# Patient Record
Sex: Male | Born: 1935 | Race: White | Hispanic: No | Marital: Married | Smoking: Former smoker
Health system: Southern US, Community
[De-identification: ages and names within clinical notes are randomized; demographics above are authoritative.]

## PROBLEM LIST (undated history)

## (undated) DIAGNOSIS — J449 Chronic obstructive pulmonary disease, unspecified: Secondary | ICD-10-CM

## (undated) DIAGNOSIS — J329 Chronic sinusitis, unspecified: Secondary | ICD-10-CM

## (undated) DIAGNOSIS — F1010 Alcohol abuse, uncomplicated: Secondary | ICD-10-CM

## (undated) DIAGNOSIS — K449 Diaphragmatic hernia without obstruction or gangrene: Secondary | ICD-10-CM

## (undated) DIAGNOSIS — C3490 Malignant neoplasm of unspecified part of unspecified bronchus or lung: Secondary | ICD-10-CM

## (undated) DIAGNOSIS — K222 Esophageal obstruction: Secondary | ICD-10-CM

## (undated) DIAGNOSIS — J020 Streptococcal pharyngitis: Secondary | ICD-10-CM

## (undated) DIAGNOSIS — G459 Transient cerebral ischemic attack, unspecified: Secondary | ICD-10-CM

## (undated) DIAGNOSIS — N281 Cyst of kidney, acquired: Secondary | ICD-10-CM

## (undated) DIAGNOSIS — T7840XA Allergy, unspecified, initial encounter: Secondary | ICD-10-CM

## (undated) DIAGNOSIS — B0229 Other postherpetic nervous system involvement: Secondary | ICD-10-CM

## (undated) DIAGNOSIS — K219 Gastro-esophageal reflux disease without esophagitis: Secondary | ICD-10-CM

## (undated) DIAGNOSIS — K51411 Inflammatory polyps of colon with rectal bleeding: Secondary | ICD-10-CM

## (undated) DIAGNOSIS — I10 Essential (primary) hypertension: Secondary | ICD-10-CM

## (undated) DIAGNOSIS — F0390 Unspecified dementia without behavioral disturbance: Secondary | ICD-10-CM

## (undated) HISTORY — DX: Gastro-esophageal reflux disease without esophagitis: K21.9

## (undated) HISTORY — DX: Allergy, unspecified, initial encounter: T78.40XA

## (undated) HISTORY — DX: Chronic obstructive pulmonary disease, unspecified: J44.9

## (undated) HISTORY — DX: Inflammatory polyps of colon with rectal bleeding: K51.411

## (undated) HISTORY — DX: Chronic sinusitis, unspecified: J32.9

## (undated) HISTORY — DX: Unspecified dementia, unspecified severity, without behavioral disturbance, psychotic disturbance, mood disturbance, and anxiety: F03.90

## (undated) HISTORY — PX: LUNG REMOVAL, PARTIAL: SHX233

## (undated) HISTORY — DX: Other postherpetic nervous system involvement: B02.29

## (undated) HISTORY — DX: Esophageal obstruction: K22.2

## (undated) HISTORY — DX: Streptococcal pharyngitis: J02.0

## (undated) HISTORY — DX: Cyst of kidney, acquired: N28.1

## (undated) HISTORY — DX: Diaphragmatic hernia without obstruction or gangrene: K44.9

## (undated) HISTORY — DX: Alcohol abuse, uncomplicated: F10.10

---

## 2001-06-15 HISTORY — PX: LAPAROSCOPIC CHOLECYSTECTOMY: SUR755

## 2001-11-07 ENCOUNTER — Inpatient Hospital Stay (HOSPITAL_COMMUNITY): Admission: EM | Admit: 2001-11-07 | Discharge: 2001-11-16 | Payer: Self-pay | Admitting: Emergency Medicine

## 2001-11-07 ENCOUNTER — Encounter (INDEPENDENT_AMBULATORY_CARE_PROVIDER_SITE_OTHER): Payer: Self-pay | Admitting: Specialist

## 2001-11-07 ENCOUNTER — Encounter: Payer: Self-pay | Admitting: Family Medicine

## 2001-11-07 ENCOUNTER — Encounter: Payer: Self-pay | Admitting: Emergency Medicine

## 2001-11-10 ENCOUNTER — Encounter: Payer: Self-pay | Admitting: Family Medicine

## 2001-11-11 ENCOUNTER — Encounter: Payer: Self-pay | Admitting: Family Medicine

## 2001-11-15 ENCOUNTER — Encounter: Payer: Self-pay | Admitting: General Surgery

## 2001-11-16 ENCOUNTER — Encounter: Payer: Self-pay | Admitting: General Surgery

## 2001-11-16 ENCOUNTER — Encounter: Payer: Self-pay | Admitting: Surgery

## 2001-11-22 ENCOUNTER — Encounter: Admission: RE | Admit: 2001-11-22 | Discharge: 2001-11-22 | Payer: Self-pay | Admitting: Family Medicine

## 2005-12-31 ENCOUNTER — Inpatient Hospital Stay (HOSPITAL_COMMUNITY): Admission: EM | Admit: 2005-12-31 | Discharge: 2006-01-04 | Payer: Self-pay | Admitting: Emergency Medicine

## 2005-12-31 ENCOUNTER — Ambulatory Visit: Payer: Self-pay | Admitting: Internal Medicine

## 2006-01-01 ENCOUNTER — Encounter: Payer: Self-pay | Admitting: Internal Medicine

## 2006-01-01 ENCOUNTER — Ambulatory Visit: Payer: Self-pay | Admitting: Internal Medicine

## 2006-01-11 ENCOUNTER — Ambulatory Visit (HOSPITAL_COMMUNITY): Admission: RE | Admit: 2006-01-11 | Discharge: 2006-01-11 | Payer: Self-pay | Admitting: Internal Medicine

## 2006-01-22 ENCOUNTER — Encounter: Admission: RE | Admit: 2006-01-22 | Discharge: 2006-01-22 | Payer: Self-pay | Admitting: Surgery

## 2006-01-28 ENCOUNTER — Ambulatory Visit (HOSPITAL_COMMUNITY): Admission: RE | Admit: 2006-01-28 | Discharge: 2006-01-28 | Payer: Self-pay | Admitting: Surgery

## 2006-01-28 ENCOUNTER — Encounter (INDEPENDENT_AMBULATORY_CARE_PROVIDER_SITE_OTHER): Payer: Self-pay | Admitting: Specialist

## 2006-01-29 ENCOUNTER — Ambulatory Visit (HOSPITAL_COMMUNITY): Admission: RE | Admit: 2006-01-29 | Discharge: 2006-01-29 | Payer: Self-pay | Admitting: Interventional Radiology

## 2006-02-19 ENCOUNTER — Ambulatory Visit: Admission: RE | Admit: 2006-02-19 | Discharge: 2006-05-20 | Payer: Self-pay | Admitting: Radiation Oncology

## 2006-03-09 ENCOUNTER — Encounter (INDEPENDENT_AMBULATORY_CARE_PROVIDER_SITE_OTHER): Payer: Self-pay | Admitting: *Deleted

## 2006-03-09 ENCOUNTER — Inpatient Hospital Stay (HOSPITAL_COMMUNITY): Admission: RE | Admit: 2006-03-09 | Discharge: 2006-03-13 | Payer: Self-pay | Admitting: Surgery

## 2006-04-20 ENCOUNTER — Encounter: Admission: RE | Admit: 2006-04-20 | Discharge: 2006-04-20 | Payer: Self-pay | Admitting: Surgery

## 2007-05-09 ENCOUNTER — Ambulatory Visit: Admission: RE | Admit: 2007-05-09 | Discharge: 2007-06-14 | Payer: Self-pay | Admitting: Radiation Oncology

## 2010-07-05 ENCOUNTER — Encounter: Payer: Self-pay | Admitting: Surgery

## 2010-07-06 ENCOUNTER — Encounter: Payer: Self-pay | Admitting: Surgery

## 2010-07-18 DIAGNOSIS — G459 Transient cerebral ischemic attack, unspecified: Secondary | ICD-10-CM

## 2010-08-06 ENCOUNTER — Inpatient Hospital Stay (HOSPITAL_COMMUNITY)
Admission: EM | Admit: 2010-08-06 | Discharge: 2010-08-09 | DRG: 069 | Disposition: A | Discharge status: Home Health | Payer: Medicare Other | Attending: Internal Medicine | Admitting: Internal Medicine

## 2010-08-06 ENCOUNTER — Emergency Department (HOSPITAL_COMMUNITY): Payer: Medicare Other

## 2010-08-06 ENCOUNTER — Encounter (HOSPITAL_COMMUNITY): Payer: Self-pay | Admitting: Radiology

## 2010-08-06 DIAGNOSIS — R55 Syncope and collapse: Secondary | ICD-10-CM | POA: Diagnosis present

## 2010-08-06 DIAGNOSIS — R4701 Aphasia: Secondary | ICD-10-CM | POA: Diagnosis present

## 2010-08-06 DIAGNOSIS — G459 Transient cerebral ischemic attack, unspecified: Principal | ICD-10-CM | POA: Diagnosis present

## 2010-08-06 DIAGNOSIS — E785 Hyperlipidemia, unspecified: Secondary | ICD-10-CM | POA: Diagnosis present

## 2010-08-06 DIAGNOSIS — K219 Gastro-esophageal reflux disease without esophagitis: Secondary | ICD-10-CM | POA: Diagnosis present

## 2010-08-06 DIAGNOSIS — J449 Chronic obstructive pulmonary disease, unspecified: Secondary | ICD-10-CM | POA: Diagnosis present

## 2010-08-06 DIAGNOSIS — Z85118 Personal history of other malignant neoplasm of bronchus and lung: Secondary | ICD-10-CM

## 2010-08-06 DIAGNOSIS — J4489 Other specified chronic obstructive pulmonary disease: Secondary | ICD-10-CM | POA: Diagnosis present

## 2010-08-06 DIAGNOSIS — D72829 Elevated white blood cell count, unspecified: Secondary | ICD-10-CM | POA: Diagnosis present

## 2010-08-06 DIAGNOSIS — F0390 Unspecified dementia without behavioral disturbance: Secondary | ICD-10-CM | POA: Diagnosis present

## 2010-08-06 HISTORY — DX: Transient cerebral ischemic attack, unspecified: G45.9

## 2010-08-06 HISTORY — DX: Essential (primary) hypertension: I10

## 2010-08-06 HISTORY — DX: Malignant neoplasm of unspecified part of unspecified bronchus or lung: C34.90

## 2010-08-06 LAB — CBC
Hemoglobin: 13.4 g/dL (ref 13.0–17.0)
RBC: 4.32 MIL/uL (ref 4.22–5.81)
WBC: 13.2 10*3/uL — ABNORMAL HIGH (ref 4.0–10.5)

## 2010-08-06 LAB — POCT CARDIAC MARKERS
Myoglobin, poc: 82 ng/mL (ref 12–200)
Troponin i, poc: <0.05 ng/mL (ref 0.00–0.09)

## 2010-08-06 LAB — URINALYSIS, ROUTINE W REFLEX MICROSCOPIC
Hgb urine dipstick: NEGATIVE
Protein, ur: NEGATIVE mg/dL
Specific Gravity, Urine: 1.008 (ref 1.005–1.030)
Urine Glucose, Fasting: NEGATIVE mg/dL
Urobilinogen, UA: 0.2 mg/dL (ref 0.0–1.0)

## 2010-08-06 LAB — POCT I-STAT, CHEM 8
HCT: 42 % (ref 39.0–52.0)
Hemoglobin: 14.3 g/dL (ref 13.0–17.0)
Potassium: 4.6 mEq/L (ref 3.5–5.1)
Sodium: 134 mEq/L — ABNORMAL LOW (ref 135–145)
TCO2: 28 mmol/L (ref 0–100)

## 2010-08-06 LAB — DIFFERENTIAL
Basophils Absolute: 0.1 10*3/uL (ref 0.0–0.1)
Basophils Relative: 0 % (ref 0–1)
Neutro Abs: 10.5 10*3/uL — ABNORMAL HIGH (ref 1.7–7.7)
Neutrophils Relative %: 80 % — ABNORMAL HIGH (ref 43–77)

## 2010-08-06 LAB — CK TOTAL AND CKMB (NOT AT ARMC): Relative Index: INVALID (ref 0.0–2.5)

## 2010-08-06 LAB — TROPONIN I: Troponin I: <0.01 ng/mL (ref 0.00–0.06)

## 2010-08-07 ENCOUNTER — Inpatient Hospital Stay (HOSPITAL_COMMUNITY): Payer: Medicare Other

## 2010-08-07 ENCOUNTER — Encounter (HOSPITAL_COMMUNITY): Payer: Self-pay | Admitting: Radiology

## 2010-08-07 LAB — CARDIAC PANEL(CRET KIN+CKTOT+MB+TROPI)
CK, MB: 2.4 ng/mL (ref 0.3–4.0)
Total CK: 86 U/L (ref 7–232)
Troponin I: <0.01 ng/mL (ref 0.00–0.06)

## 2010-08-07 LAB — COMPREHENSIVE METABOLIC PANEL
AST: 22 U/L (ref 0–37)
Albumin: 3.7 g/dL (ref 3.5–5.2)
Chloride: 97 mEq/L (ref 96–112)
Creatinine, Ser: 0.97 mg/dL (ref 0.4–1.5)
GFR calc Af Amer: >60 mL/min (ref >60)
Total Bilirubin: 0.5 mg/dL (ref 0.3–1.2)

## 2010-08-07 LAB — BASIC METABOLIC PANEL
CO2: 30 mEq/L (ref 19–32)
Chloride: 94 mEq/L — ABNORMAL LOW (ref 96–112)
Glucose, Bld: 104 mg/dL — ABNORMAL HIGH (ref 70–99)
Potassium: 4.4 mEq/L (ref 3.5–5.1)
Sodium: 134 mEq/L — ABNORMAL LOW (ref 135–145)

## 2010-08-07 LAB — LIPID PANEL
HDL: 54 mg/dL (ref >39)
Total CHOL/HDL Ratio: 3.6 RATIO
Triglycerides: 82 mg/dL (ref <150)
VLDL: 16 mg/dL (ref 0–40)

## 2010-08-07 LAB — CBC
HCT: 37.9 % — ABNORMAL LOW (ref 39.0–52.0)
Hemoglobin: 12.8 g/dL — ABNORMAL LOW (ref 13.0–17.0)
MCHC: 33.8 g/dL (ref 30.0–36.0)
WBC: 9.8 10*3/uL (ref 4.0–10.5)

## 2010-08-07 LAB — URINE CULTURE: Culture  Setup Time: 201202221806

## 2010-08-07 LAB — BRAIN NATRIURETIC PEPTIDE: Pro B Natriuretic peptide (BNP): 121 pg/mL — ABNORMAL HIGH (ref 0.0–100.0)

## 2010-08-07 MED ORDER — GADOBENATE DIMEGLUMINE 529 MG/ML IV SOLN
20.0000 mL | Freq: Once | INTRAVENOUS | Status: AC
  Administered 2010-08-07: 20 mL via INTRAVENOUS

## 2010-08-08 DIAGNOSIS — I359 Nonrheumatic aortic valve disorder, unspecified: Secondary | ICD-10-CM

## 2010-08-08 LAB — DIFFERENTIAL
Basophils Absolute: 0 K/uL (ref 0.0–0.1)
Basophils Relative: 0 % (ref 0–1)
Eosinophils Absolute: 0.1 K/uL (ref 0.0–0.7)
Eosinophils Relative: 1 % (ref 0–5)
Lymphocytes Relative: 16 % (ref 12–46)
Lymphs Abs: 2 K/uL (ref 0.7–4.0)
Monocytes Absolute: 1.1 K/uL — ABNORMAL HIGH (ref 0.1–1.0)
Monocytes Relative: 9 % (ref 3–12)
Neutro Abs: 9.4 K/uL — ABNORMAL HIGH (ref 1.7–7.7)
Neutrophils Relative %: 75 % (ref 43–77)

## 2010-08-08 LAB — CBC
HCT: 36.8 % — ABNORMAL LOW (ref 39.0–52.0)
Hemoglobin: 12.3 g/dL — ABNORMAL LOW (ref 13.0–17.0)
MCH: 30.1 pg (ref 26.0–34.0)
MCHC: 33.4 g/dL (ref 30.0–36.0)
MCV: 90.2 fL (ref 78.0–100.0)
Platelets: 225 K/uL (ref 150–400)
RBC: 4.08 MIL/uL — ABNORMAL LOW (ref 4.22–5.81)
RDW: 13.4 % (ref 11.5–15.5)
WBC: 12.6 K/uL — ABNORMAL HIGH (ref 4.0–10.5)

## 2010-08-08 LAB — COMPREHENSIVE METABOLIC PANEL
ALT: 18 U/L (ref 0–53)
CO2: 27 mEq/L (ref 19–32)
Calcium: 8.9 mg/dL (ref 8.4–10.5)
Creatinine, Ser: 0.94 mg/dL (ref 0.4–1.5)
GFR calc non Af Amer: >60 mL/min (ref >60)
Glucose, Bld: 102 mg/dL — ABNORMAL HIGH (ref 70–99)
Sodium: 130 mEq/L — ABNORMAL LOW (ref 135–145)
Total Protein: 6.9 g/dL (ref 6.0–8.3)

## 2010-08-08 LAB — MAGNESIUM: Magnesium: 1.9 mg/dL (ref 1.5–2.5)

## 2010-08-08 LAB — PROTIME-INR
INR: 1.05 (ref 0.00–1.49)
Prothrombin Time: 13.9 seconds (ref 11.6–15.2)

## 2010-08-09 NOTE — Discharge Summary (Signed)
Alex [name], Alex [name] NO.:  [id number]  MEDICAL RECORD NO.:  [id number]           PATIENT TYPE:  I  LOCATION:  3742                         FACILITY:  [facility]  PHYSICIAN:  Talmage Nap, MD  DATE OF BIRTH:  1933/01/04  DATE OF ADMISSION:  08/06/2010 DATE OF DISCHARGE:  08/08/2010                        DISCHARGE SUMMARY - REFERRING   PRIMARY CARE PHYSICIAN:  Unassigned.  CONSULTANT:  Neurology, Dr. Joycelyn Schmid, MD  DISCHARGE DIAGNOSES: 1. Questionable transient ischemic attack. 2. Chronic obstructive pulmonary disease. 3. Questionable dementia. 4. Leukocytosis, source is unknown. 5. Gastroesophageal reflux disease. 6. Dyslipidemia. 7. History of lung cancer status post lobectomy.  HISTORY OF PRESENT ILLNESS:  The patient is a 77 year old Caucasian male with history of lung CA status post lobectomy, status post radiotherapy was admitted to the hospital on August 06, 2010, by Dr. Lonia Blood, with history of having passed out and was said to have had right-sided weakness.  He was apparently transferred from [facility] for syncopal workup.  The patient denied any premonitory symptoms prior to the onset of fall.  He denied any chest pain, denied any shortness of breath, and was subsequently admitted for further evaluation.  PREADMISSION MEDICATIONS: 1. Amlodipine 5 mg p.o. daily. 2. Clorazepate dipotassium 7.5 mg p.o. b.i.d. 3. Metoprolol 50 mg p.o. b.i.d.  ALLERGIES:  He has no known allergies.  PAST SURGICAL HISTORY: 1. Right lung CA status post lobectomy. 2. Esophageal stricture status post dilatation. 3. Cholecystectomy.  ALLERGIES:  No known allergies.  SOCIAL HISTORY:  Negative for alcohol and tobacco use.  The patient is retired.  FAMILY HISTORY:  Positive for breast CA.  REVIEW OF SYSTEMS:  Essentially as documented in the initial history and physical.  At that time, the patient was seen by the  admitting physician.  PHYSICAL EXAMINATION:  VITAL SIGNS:  Temperature 98.3, blood pressure is 173/82, subsequent repeat was 149/85, pulse 82, respiratory 78, and he was saturating 100% on room air. HEENT:  Pupils are reactive to light and extraocular muscles are intact. NECK:  He had no jugular venous distention.  No carotid bruit.  No lymphadenopathy. CHEST:  Was said to have been clear to auscultation.  No adventitious sounds. HEART:  Sounds are 1 and 2.  No murmur. ABDOMEN:  Soft, nontender.  Liver, spleen, and kidney not palpable. Bowel sounds are positive. EXTREMITIES:  No pedal edema. NEUROLOGIC:  Nonfocal. MUSCULOSKELETAL:  Unremarkable. SKIN:  Showed normal turgor.  LABORATORY DATA:  Initial urinalysis was negative.  Chem-8 stat showed sodium of 134, potassium of 4.6, chloride of 99, glucose 104, BUN is 10, and creatinine is 1.1.  Cardiac marker, troponin-I less than 0.05 and less than 0.01.  Comprehensive metabolic panel showed sodium of 136, potassium of 4.0, chloride of 97 with a bicarb of 28, glucose is 92, BUN is 8, and creatinine is 0.97.  Magnesium level is 2.0, phosphorus level is 3.5, and BNP 121.  Lipid panel showed total cholesterol 192, triglyceride 82, HDL cholesterol 54, LDL cholesterol 122, and TSH 2.476 normal.  Urine culture, no growth.  A repeat comprehensive metabolic panel done on August 08, 2010,  showed sodium of 130, potassium of 4.0, chloride of 94 with a bicarb of 27, glucose is 102, BUN is 8, creatinine is 0.94, magnesium level is 1.9.  Complete blood count with differential showed WBC of 12.6, hemoglobin of 12.3, hematocrit of 36.8, MCV of 90.2 with a platelet count of 2-5.  Coagulation profile showed PT 13.9, INR 1.05 and APTT of 33.  IMAGING STUDIES:  EKG as per the initial history and physical was said to have showed left bundle branch block with nonspecific ST-wave changes with LVH.  Imaging studies done include chest x-ray, it did not  show any segmental consolidation, pulmonary edema, or gross pneumothorax.  CT of the head without contrast showed chronic microvascular ischemic changes. No acute infarct seen.  CT of the chest without contrast showed severe emphysema coronary artery disease, moderate hiatal hernia.  CT head without contrast showed atrophic and nonspecific white matter changes, no acute infarct seen.  MRI of the head showed no acute infarct.  No chronic small vessel disease.  MRA of the brain showed no acute infarct, no chronic small vessel disease throughout the hemispheric white matter, there is no evidence of metastatic disease.  A 2-D echo is deferred for TEE as an outpatient.  HOSPITAL COURSE:  The patient was admitted to telemetry.  He was started on normal saline to go at a rate of 100 mL an hour, Lovenox for DVT prophylaxis.  He was also given Zofran as well for nausea.  His COPD was treated with albuterol and Atrovent nebs q.6 h. P.r.n.  Other medication given to the patient include amlodipine 5 mg p.o. daily and metoprolol 50 mg p.o. b.i.d..  The patient was also evaluated by the in-house neurologist, Dr. Marjory Lies and his impression was that of recurrent TIA, but however, one should keep toxic metabolic etiology as well as infection in mind.  He also recommended that the patient could be started on medication for dementia.  The patient was said to have had 1 episode of confusional state while on admission which was transient and this resolved spontaneously.  Also added to the patient's regimen was Namenda 10 mg p.o. daily.  So far, the patient has remained clinically stable.  He was reevaluated by me: awake, alert, oriented and very cheerful.  Examination of the patient was essentially unremarkable.  His vital signs; blood pressure is 118/63, temperature is 97.7, pulse 60, respiratory rate 20 and medically stable.  I had an extensive discussion with the patient's family about the need to have  a TEE done as an outpatient, to be arranged by PCP and all verbalized understanding.  DISCHARGE MEDICATIONS:  Medication to be taken at home will include, 1. Levaquin 500 mg p.o. daily for the next 7 days for treatment of     leukocytosis empirically. 2. Namenda 100 mg p.o. daily. 3. Simvastatin 20 mg p.o. daily. 4. Amlodipine 5 mg p.o. daily. 5. Aspirin 81 mg p.o. daily. 6. Clorazepate 7.5 mg one p.o. b.i.d. 7. Metoprolol tartrate 50 mg one p.o. b.i.d. 8. Multivitamin over-the-counter one p.o. daily. 9. Nexium (esomeprazole) over-the-counter 1 p.o. daily.     Talmage Nap, MD     CN/MEDQ  D:  08/09/2010  T:  08/09/2010  Job:  [id number]  cc:   Joycelyn Schmid, MD  Electronically Signed by Talmage Nap  on 08/09/2010 04:29:06 PM

## 2010-08-18 NOTE — Consult Note (Signed)
NAMECHANCELLOR, VANDERLOOP NO.:  [id number]  MEDICAL RECORD NO.:  [id number]           PATIENT TYPE:  I  LOCATION:  3735                         FACILITY:  [facility]  PHYSICIAN:  Joycelyn Schmid, MD   DATE OF BIRTH:  1932-11-02  DATE OF CONSULTATION:  08/07/2010 DATE OF DISCHARGE:                                CONSULTATION   REASON FOR CONSULTATION:  Code stroke evaluation, aphasia, and confusion.  HISTORY OF PRESENT ILLNESS:  A 77 year old male with history of COPD, bronchioalveolar carcinoma status post resection, dyslipidemia, who was admitted to [facility] on August 06, 2010, for recurrent episodes of TIA versus syncope.  The patient was in normal state of health, admitted to the hospital in room 3735.  He was speaking with his son this evening and approximately at 6:30 p.m., had sudden onset difficulty speaking.  Code stroke was activated at 6:41.  The patient received CT scan of the head which showed no evidence of intracranial hemorrhage.  MRI of the brain was ordered and showed no acute stroke. The patient's symptoms also were rapidly improving.  Therefore, code stroke was cancelled, IV TPA was not given.  Apparently, the patient had been worked up recently 3 months ago and 3 weeks ago for syncopal events as well as episodes of right-sided weakness.  Apparently, MRI, carotid Dopplers, echocardiogram, and Neurology consultation in [place] were negative.  We do not have these reports.  On the day of admission, the patient apparently had taken a nap, woke up, felt dizzy, and fell down to the floor.  He may have had some loss of consciousness.  At this time, the patient is not able to tell me details of that event, and I obtained this information by reviewing the H and P.  PAST MEDICAL HISTORY: 1. COPD. 2. Bronchioalveolar carcinoma, right upper lung lesion, status post     lobectomy, stage T2 N0 M0, status post radiation, mesh insertion. 3.  Dyslipidemia. 4. GERD. 5. Esophageal stricture status post dilation. 6. Peptic ulcer disease. 7. Cholecystectomy. 8. Prior tobacco, alcohol use.  ALLERGIES:  No known drug allergies.  OUTPATIENT MEDICATIONS:  Amlodipine, clorazepate, and metoprolol.  INPATIENT MEDICATIONS:  Also include Lovenox, aspirin, Atrovent, Protonix.  FAMILY HISTORY:  Mother died of breast cancer.  Father died of aneurysm.  SOCIAL HISTORY:  The patient is married and quit smoking and alcohol in 2003.  No IV drug use.  REVIEW OF SYSTEMS:  As per the HPI.  The patient is not able to provide good history due to this current aphasia/confusion.  PHYSICAL EXAMINATION:  VITAL SIGNS:  Temperature is 98.7, blood pressure 143/92, heart rate 77, respirations 18, 95% on room air. GENERAL:  He is awake and alert.  He has mildly decreased fluency.  He has some word-finding difficulties, tangential thought process.  He is able to name some simple objects but not able to identify parts of an object.  He is able to quickly identify the time on his own watch.  He is oriented to February 2002 initially and then 2012. NEUROLOGIC:  When I saw, the patient had improved compared  to that of the rapid response nurse.  He is able to read some words.  He is not able to name objects on the NIH stroke scale card although he is able to name, he calls my pen, a pencil, and is able to name my watch, also name my phone.  He is able to follow commands.  Cranial nerve examination, pupils reactive from 2-1 mm.  Extraocular muscles intact.  Facial sensation and strength symmetric.  Uvula is midline.  Shoulder shrug symmetric.  Tongue is midline.  Visual fields full to confrontation.  No extinction.  Motor examination, normal bulk and tone.  A 5/5 strength in bilateral upper and lower extremities.  Finger-nose-finger is smooth and symmetric.  Sensory examination is intact to light touch and pinprick. No extinction.  Reflexes are trace  throughout. CARDIOVASCULAR:  Regular rate and rhythm.  No murmurs, no carotid bruits.  LAB TESTING:  Sodium 134, potassium 4.4, BUN 10, creatinine 1.0. Platelets 199, white count 9.8.  LDL 122, HDL 54, cholesterol 192, triglycerides 82.  CT scan of the head which I reviewed shows severe chronic small-vessel ischemic disease.  MRI of the brain, diffusion- weighted imaging which I reviewed shows no evidence of acute ischemic infarction.  Other imaging sequences pending at the time of this dictation.  ASSESSMENT:  A 77 year old male with history of lung cancer, chronic obstructive pulmonary disease, dyslipidemia, now with recurrent episodes of aphasia and confusion.  Differential diagnoses includes recurrent transient ischemic attack, toxic, metabolic, infectious etiologies; delirium or dissociative fugue conversion disorder state.  RECOMMENDATIONS: 1. Complete MRI brain with and without contrast and MRA of the head. 2. Would consider carotid ultrasound and echocardiogram if the old     records cannot be obtained in a timely fashion.     Joycelyn Schmid, MD     VP/MEDQ  D:  08/07/2010  T:  08/07/2010  Job:  [id number]  Electronically Signed by Joycelyn Schmid  on 08/18/2010 12:38:53 PM

## 2010-08-21 NOTE — H&P (Signed)
NAMEMACINTYRE, ALEXA NO.:  [id number]  MEDICAL RECORD NO.:  [id number]           PATIENT TYPE:  E  LOCATION:  [facility]                         FACILITY:  [facility]  PHYSICIAN:  Lonia Blood, M.D.      DATE OF BIRTH:  09-03-1932  DATE OF ADMISSION:  08/06/2010 DATE OF DISCHARGE:                             HISTORY & PHYSICAL   PRIMARY CARE PHYSICIAN:  [facility].  PRESENTING COMPLAINT:  Passing out.  HISTORY OF PRESENT ILLNESS:  The patient is a 77 year old gentleman with multiple medical problems including lung cancer that was diagnosed in September 2007, followed by surgery and radiation.  The patient has not been following up with his physician since then.  Three months ago, he had an episode of right-sided weakness and almost passing out.  He was seen apparently at [facility], thereafter had a workup done for syncopal episode.  He had another episode about 3 weeks ago.  Again, he had a workup, and per the patient, he had MRI, carotid Dopplers, echocardiogram, etc., and was seen by neurologist, Dr. Ninetta Lights over at [facility].  He was told that all his workup was normal.  He came in today with another episode.  Per the patient, he took a nap this afternoon. When he woke up, he felt weak and dizzy.  He was weak all over and fell to the floor.  Per the patient, he was up for about 20 minutes, but was fully aware of what was going on with him.  He just could not talk.  He tried to call for help with his lifesaver button; however, they could not hear him.  He is not fully back on his feet, able to walk around. No complaints at this point.  In the ED, he was found to have a new left bundle-branch block on his EKG, which was not seen previously.  The patient denied any chest pain.  No shortness of breath.  He was started on metoprolol on the 13th, which is 10 days ago.  However, when he had his first episode, he was not on beta-blocker, and his heart rate  per EMS and here has been normal.  PAST MEDICAL HISTORY:  Significant for, 1. COPD. 2. History of lung cancer with right upper lung lesion, which at that     time positive for bronchioalveolar adenocarcinoma.  It was rated as     T2 N0 M0.  The patient apparently had radiation mesh inserted after     a partial lobectomy. 3. History of dyslipidemia. 4. GERD. 5. History of esophageal stricture, status post previous dilatation in     2003. 6. History of peptic ulcer disease, status post cholecystectomy. 7. Remote history of tobacco and alcohol use.  ALLERGIES:  He has no known drug allergies.  CURRENT MEDICATIONS: 1. Amlodipine 5 mg daily. 2. Clorazepate dipotassium 7.5 mg twice a day. 3. Metoprolol 50 mg twice a day.  SOCIAL HISTORY:  The patient is married.  [place]  He quit smoking and drinking all in 2003.  No IV drug use. He is able  to do his ADLs.  The patient has not been following with his doctors despite his medical problems.  FAMILY HISTORY:  Mother died of breast cancer.  Father died of some type of aneurysm.  REVIEW OF SYSTEMS:  All systems reviewed currently are negative except per HPI.  PHYSICAL EXAMINATION:  VITAL SIGNS:  His temperature is 98.3, blood pressure initially 173/82, currently 149/85 with a pulse of 83, respiratory rate 78.  His sat is 100% on room air. GENERAL:  He is a very pleasant man, in no acute distress. HEENT:  PERRL.  EOMI.  No pallor, no jaundice.  No rhinorrhea. NECK:  Supple.  No JVD, no lymphadenopathy. RESPIRATORY:  He has good air entry bilaterally.  No wheezes nor rales. No crackles. CARDIOVASCULAR:  He has S1 and S2.  No audible murmurs. ABDOMEN:  Soft, full, nontender, with positive bowel sounds. EXTREMITIES:  Showed no edema, cyanosis, or clubbing. SKIN:  No visible rashes.  No ulcers.  LABORATORY DATA:  His white count is 13.2 with left shift, ANC of 10.5, hemoglobin 13.4, platelet 221.  His  sodium is 134, potassium 4.6, chloride 99, CO2 of 28, ionized calcium of 0.96, BUN 10, and creatinine 1.1.  Initial cardiac markers are negative.  EKG showed sinus rhythm with some left bundle-branch block and nonspecific ST changes.  There is evidence of LVH by voltage criteria.  This is a change from his previous EKG in 2007.  Head CT without contrast showed chronic microvascular ischemia, but no acute infarct.  Chest x-ray showed change in the configuration of radiation seed implants within the right lung, which may be recurrent tumor versus radiation therapy-type changes.  There is no consolidation, pulmonary edema, or pneumothorax.  ASSESSMENT:  This is a 77 year old gentleman who is here with what appears to be recurrent transient ischemic attacks versus syncope.  The patient has apparently had at least 2 full workups in the last 3 months for the same symptoms.  This time around, he did not have any lateralizing signs.  It could also be a seizure that he was having.  Due to the patient's other medical problems, this could be something related to his lung cancer, which could be recurrent also based on his chest x- ray results.  He has baseline history of chronic obstructive pulmonary disease.  He is not using any inhalers.  However, he was not short of breath for [place] to think this may be metabolic-like hypercarbia, and the fact that he fully recovered now makes more likely to be a transient ischemic attack or syncopal episode.  His blood pressure and blood sugar at least by EMS were within normal when they arrived.  This could be vasovagal in nature also.  PLAN: 1. Recurrent TIAs versus syncope.  We will admit the patient to tele     bed.  We will obtain records from [facility] and his     neurologist to see the extent of workup he has had done at least 3     weeks ago, so we do not have to repeat most of the major workups.     I suspect that this may be related to some form  of dehydration, but     also it is worrisome that he may have recurrent lung cancer and may     be playing a role.  In the meantime, I will check another 2-D     echocardiogram here.  Check his serial cardiac enzymes, TSH, BMP.  I will hold off on carotid Dopplers or MRI until we see the current     results from his previous workup.  If we have to, we can reorder     those after reviewing his previous results. 2. History of lung cancer.  Again, this may be recurrent.  He has     radiation implant seeds, but is showing changes.  I will check a     high-resolution CT scan of the chest to see if maybe his cancer is     coming back.  We will probably get Oncology consult again in the     morning to review his case.  He may need to get a PET scan as well. 3. History of COPD.  He is not wheezing at this point.  I will put him     on empiric nebulizers in the hospital. 4. GERD.  Again, I will put him on some PPIs. 5. Hypertension.  The patient's blood pressure seems reasonable on     amlodipine and metoprolol.  It is not clear if he became     bradycardic, probably passing out, but at least at this point, his     heart rate has remained steady around 80.  I will put him on his     home medications here and that way we can observe what his heart     rate is doing.  If it drops, then that may be the cause of his     recurrent syncope. 6. Hyperlipidemia.  I will check fasting lipid panel and see if he     needs to get on statin. 7. Leukocytosis.  The cause is not entirely clear.  His urinalysis     seems entirely normal and no evidence of pneumonia, so this could     be mainly reactive.  We will watch it also closely.  Further     treatment depends on the patient's response to our initial measures     and what we may be able to find.     Lonia Blood, M.D.     Verlin Grills  D:  08/06/2010  T:  08/06/2010  Job:  [id number]  Electronically Signed by Lonia Blood M.D. on 08/21/2010 06:30:43 AM

## 2010-09-04 ENCOUNTER — Other Ambulatory Visit (HOSPITAL_COMMUNITY): Payer: Self-pay | Admitting: Family Medicine

## 2010-09-04 DIAGNOSIS — G459 Transient cerebral ischemic attack, unspecified: Secondary | ICD-10-CM

## 2010-09-05 ENCOUNTER — Ambulatory Visit (HOSPITAL_COMMUNITY): Payer: Medicare Other | Attending: Family Medicine | Admitting: Radiology

## 2010-09-05 DIAGNOSIS — R0789 Other chest pain: Secondary | ICD-10-CM

## 2010-09-05 DIAGNOSIS — G459 Transient cerebral ischemic attack, unspecified: Secondary | ICD-10-CM

## 2010-09-09 ENCOUNTER — Ambulatory Visit (HOSPITAL_COMMUNITY)
Admission: RE | Admit: 2010-09-09 | Discharge: 2010-09-09 | Disposition: A | Discharge status: Home / Self Care | Payer: Medicare Other | Source: Ambulatory Visit | Attending: Cardiology | Admitting: Cardiology

## 2010-09-09 ENCOUNTER — Other Ambulatory Visit: Payer: Self-pay | Admitting: Cardiology

## 2010-09-09 DIAGNOSIS — G459 Transient cerebral ischemic attack, unspecified: Secondary | ICD-10-CM | POA: Insufficient documentation

## 2010-10-31 NOTE — Consult Note (Signed)
[place]  Patient:    Alex [name], Alex [name] Visit Number: [id number] MRN: [id number]          Service Type: MED Location: [phone] Attending Physician:  McDiarmid, Leighton Roach. Dictated by:   Ollen Gross. Vernell Morgans, M.D. Proc. Date: 11/11/01 Admit Date:  11/07/2001                            Consultation Report  HISTORY:  The patient is a 77 year old white male who developed, several days ago, the acute onset of epigastric left upper quadrant pain.  He described it as a sharp, stabbing pain.  He developed some sweats and chills with nausea, but no vomiting.  He denied any chest pain, shortness of breath, diarrhea or dysuria.  He was brought to the hospital for further evaluation, at which time he was found to have evidence of stones in his gallbladder as well as acute pancreatitis, with significant elevation of his amylase and lipase.  PAST MEDICAL HISTORY: 1. Polyps in his colon that have been benign. 2. Peptic ulcer disease.  PAST SURGICAL HISTORY:  None.  MEDICATIONS:  Aciphex.  ALLERGIES:  No known drug allergies.  SOCIAL HISTORY:  He quit smoking about six months ago and drinks about two beers per day.  FAMILY HISTORY:  Significant for breast cancer in his mother and an aneurysm in his father.  PHYSICAL EXAMINATION:  GENERAL:  Well-developed, well-nourished white male in no acute distress.  SKIN:  Warm and dry with no jaundice.  HEENT:  Slightly icteric sclerae.  Extraocular movements intact.  Pupils are equal, round and reactive to light.  LUNGS:  Clear bilaterally.  HEART:  Regular rate and rhythm.  ABDOMEN:  Soft and nontender.  EXTREMITIES:  No clubbing, cyanosis, or edema.  NEUROLOGIC:  Alert and oriented x4.  HEMATOLOGIC:  I could palpate no lymphadenopathy.  ASSESSMENT AND PLAN:  This is a 77 year old white male who has been recovering from an episode of gallstone pancreatitis.  He is continuing to recover. Because of  this, I think he will require cholecystectomy, probably during this hospitalization.    We would certainly prefer to wait until his pancreatitis settles down before subjecting him to an operation to remove the gallbladder. I have discussed this with him as well as the risks and benefits of the surgery including some of the technical aspects.  He understands and agrees to proceed.  We will plan to follow him closely with you and follow his pancreatic and liver function enzymes. Dictated by:   Ollen Gross. Vernell Morgans, M.D. Attending Physician:  McDiarmid, Tawanna Cooler D. DD:  11/11/01 TD:  11/12/01 Job: [id number]

## 2010-10-31 NOTE — Discharge Summary (Signed)
[name], [name] NO.:  [id number]   MEDICAL RECORD NO.:  [id number]          PATIENT TYPE:  INP   LOCATION:  3705                         FACILITY:  [facility]   PHYSICIAN:  Coralie Carpen, M.D. DATE OF BIRTH:  Oct 15, 1932   DATE OF ADMISSION:  12/31/2005  DATE OF DISCHARGE:  01/04/2006                                 DISCHARGE SUMMARY   DICTATED BY:  Oris Drone, MS4 dictating for Coralie Carpen, M.D.   DISCHARGE DIAGNOSIS:  1. Palpitations, night sweats, fatigue.  2. Right lung mass, by chest x-ray and CT.  3. Hyperlipidemia.  4. Left shoulder pain.  5. Chronic obstructive pulmonary disease.  6. Gastroesophageal reflux disease.   DISCHARGE MEDICATIONS:  1. Aspirin 325 mg enteric coated by mouth daily.  2. Toprol 25 mg p.o. twice a day.  3. Zocor 40 mg p.o. every evening.  4. Nexium 40 mg 1-2 tabs by mouth daily.   DISPOSITION:  The patient was discharged home in stable improved condition.  Alex [name] is to return to [facility] for a CT scan on  Monday, January 11, 2006 to follow up regarding right lung nodule.  The patient  was also instructed to inform his primary care Alex [name] to give him a  referral for a thoracic surgeon after he receives the results of the CT  scan.  The patient is to follow up with primary care Alex [name], Dr. Susette Racer,  on Friday, January 08, 2006.   PROCEDURES PERFORMED:  1. December 31, 2005, chest x-ray results:  Right upper lung density.  Severe      COPD.  Chest x-ray was compared with 2003, which showed no right lung      mass.  2. December 31, 2005, chest CT results:  Right upper lung speculated mass,      worrisome for bronchogenic carcinoma.  3. January 01, 2006, 2D echo results:  Normal left ventricular systolic      function.  Left ventricular ejection fraction 65%.  4. January 03, 2006, Myoview/Adenosine results:  No evidence of infarct or      ischemia, left ventricular ejection fraction 56%.   CONSULTATIONS:   Consultation January 01, 2006:  Docia Furl, M.D.,  cardiology.   ADMITTING H&P AND LABS:  Alex [name] is a 77 year old white male with  complaints of 3 weeks of night sweats, fatigue, and palpitations with  increasing intensity of diaphoresis and fatigue.  The patient noticed mild  diaphoresis 3 weeks ago, increased 2 days ago with palpitations.  The  patient denies any changes in his regular routine.  No domestic or  international travel.  Three weeks ago, night sweats, no nausea or vomiting,  and now weight loss.  Two days ago, palpitations on exertion, the patient  admits that sitting decreased the pain, night sweats increased.  Today prior  to admission the patient continued to have palpitations and diaphoresis on  exertion.  Took aspirin, rested, decided to follow up with primary care  Calliope Delangel.  Primary care physicians office, EKG was performed and the patient  was sent to ED  where he received Heparin and nitroglycerine.  The patient  denies chest pain, endorses left arm pain for 3 days after working in the  yard.  Describes arm pain as soreness radiating down arm and aching shoulder  pain, pain worse at night.  The patient had increased nausea, no fevers or  chills, no headache or dizziness, increased fatigue x2-3 weeks, an  occasional cough, no dyspnea or hemoptysis.  The patient denies history of  coronary artery disease, is physically active, has a history of 2 packs of  cigarettes a day for 50 years, quit in Nov 19, 2000.  Father deceased with aneurysm.  The patient denies recent surgeries, recent PPD test last year maybe.  The  patient has no history of PE or DVT, denied recent upper respiratory  infection.   PHYSICAL EXAMINATION:  VITAL SIGNS:  Temperature is 97.5, blood pressure is  126.79, pulse is 70, respiration rate is 20, O2 sat is 97% on room air,  weight is 68.8 kg.  GENERAL:  The patient is in no acute distress, thin, increased heart rate  while taking history.  EYE  EXAM:  Pupils are equal, round, and reactive to light, extraocular  muscles intact.  ENT:  Moist mucous membranes.  NECK:  Supple without lymphadenopathy.  PULMONARY EXAM:  Breath sounds decreased throughout, no wheezes, rhonchi, or  crackles.  CARDIOVASCULAR EXAM:  The patient had distant heart sounds, regular rate and  rhythm, no murmurs, gallops, or rubs, no JVD, no carotid bruits, palpable  pulses.  GI EXAM:  Positive bowel sounds, soft, nontender, nondistended, incision  right upper quadrant with incisional hernia.  EXTREMITIES:  No edema.  GENITOURINARY EXAM:  Deferred.  SKIN:  No rash or lesions.  MUSCULOSKELETAL:  No muscle atrophy, range of motion with left upper  extremity lessened range of motion and right upper extremity, left shoulder  joint without erythema, swelling, or tenderness.  NEURO EXAM:  Cranial nerves II-XII are grossly intact, upper extremities  5/5, lower extremities 5/5, positive sensation throughout, 2+ DTRs,  cerebellum intact.   LABS ON ADMISSION:  B-met:  Sodium of 136, potassium of 4.2, chloride of 99,  bicarb of 29, BUN of 5, creatinine of 0.9, glucose of 115.  CBC:  White  blood count is 8.3, platelets are 204,000, hemoglobin is 11.8, hematocrit is  35.8.  LFTs include bilirubin of 6.9, alk phos of 63, AST of 21, ALT of 16,  protein of 6.1, albumin of 3.1, calcium of 8.1.  Cardiac enzymes, serial  enzymes:  CK-MB of less than 1, less than 1, 1.1.  Troponin of less than  0.05, less than 0.05, 0.03.  CK was 5.7, 54.9, 42.  TSH was 0.9 for 3.  Lipid panel:  Cholesterol of 202, triglycerides of 44, HDL of 43, LDL of  150.  Venous blood bases:  Ph of 7.36, pCO2 of 51.7, bicarb of 27.7, O2 sat  of 97%, base deficit 1.0, base excess 1.0.  EKG showed R-wave progression  and inverted T-waves in lateral leads.  Chest x-ray on December 31, 2005 showed  right lung mass and advanced COPD as compared with chest x-ray from 11/19/2001.  HOSPITAL COURSE:  1. Palpitations,  night sweats, fatigue:  The patient complained of      progressive fatigue and night sweats for 3 weeks with palpitations,      diaphoresis, and left shoulder pain 2 days a go.  Differential      diagnosis includes MI, paroxysmal atrial fibrillation, thyrotoxicosis,  anemia, and paraneoplastic syndrome secondary to right upper lobe mass.      The patient's cardiac enzymes were cycled and were within normal      limits.  A 2D echo was performed on January 01, 2006, results were normal      left ventricular systolic function with ejection fraction at 65%.  A      Myoview was performed on January 03, 2006 that showed no evidence of      infarct or ischemia with a left ventricular ejection fraction of 56%.      A 2nd lipid panel was performed.  Results include a cholesterol of 213,      LDL of 151, HDL of 46, triglycerides of 82.  The patient's H&H was      within normal limits.  The patient's TSH and free T4 were also within      normal limits.  The patient's admitting symptoms are most likely      secondary to a-fib or paraneoplastic syndrome secondary to right lung      mass.  The patient was placed on Toprol and had cardiovascular consult      to control heart rate.  2. Right lung mass:  New lung mass on chest x-ray on December 31, 2005 as      compared with chest x-ray in 2003.  The patient has a history of 2      packs per day x50 years, quit in 2002.  The patient denied night      sweats, nausea and vomiting, hemoptysis, and melena during hospital      course.  The patient is to follow up outpatient for a PET scan and lung      biopsy.  3. Hyperlipidemia:  Lipid panel showed a cholesterol of 213, the patient      was placed on Zocor and counseled about a heart healthy diet.  4. Left shoulder pain:  The patient complained of left shoulder pain after      yard activity, which resolved during hospital course without      medications.  5. Chronic obstructive pulmonary disease:  Stable during  hospital course.  6. Gastroesophageal reflux disease:  The patient was placed on Protonix 40      mg once a day.   DISCHARGE VITALS AND LABS:  The patient's temperature on discharge was 98,  blood pressure was 120/88, pulse was 72, respiration rate was 18, O2 sat was  98% on room air.  CBC:  White blood  count was 6.5, platelets were 184,000,  hemoglobin was 11.9, hematocrit was 35.5.  B-met included a sodium of 137,  potassium of 3.8, chloride of 100, bicarb of 30, BUN 7, creatinine of 0.9,  glucose of 98, calcium of 8.8.      Coralie Carpen, M.D.  Electronically Signed     Coralie Carpen, M.D.  Electronically Signed    FR/MEDQ  D:  01/04/2006  T:  01/04/2006  Job:  [id number]

## 2010-10-31 NOTE — H&P (Signed)
[place]  Patient:    Alex [name], Alex [name] Visit Number: [id number] MRN: [id number]          Service Type: MED Location: [phone] Attending Physician:  McDiarmid, Leighton Roach. Dictated by:   Kevin Fenton, M.D. Admit Date:  11/07/2001                           History and Physical  CHIEF COMPLAINT:  Abdominal pain.  HISTORY OF PRESENT ILLNESS:  This is a 77 year old Caucasian male with a sudden onset of abdominal pain, epigastric and left upper quadrant, that started at 8:30 a.m. this morning after breakfast.  He rated it a 10/10.  Pain was ultimately relieved by the medicines here down to a 5/10.  The pain did not radiate.  It was associated with diaphoresis, chills, nausea and vomiting. He did have a normal bowel movement today and denies any melena or hematochezia.  He denies any weight loss.  He denies any chest pain, shortness of breath or PND and he has had no problems with peripheral edema.  No urinary complaints.  Denies coronary artery disease, diabetes, hypertension, COPD, liver or kidney disease or any history of cancer.  REVIEW OF SYSTEMS:  As above.  PAST MEDICAL HISTORY:  Positive for EGD and colonoscopy in December 2002 by Dr. Vania Rea. Jarold Motto which showed benign polyps and he underwent esophageal dilatation.  He also has a history of stomach ulcer.  He has never had any surgeries or other hospitalizations.  MEDICATIONS:  He is on Aciphex every day and a multivitamin.  ALLERGIES:  No known drug allergies.  SOCIAL HISTORY:  He lives with his wife of 32 years, 2 beers a day and he quit tobacco a year ago after 2 packs a day for 50 years.  He is a retired Copywriter, advertising.  FAMILY HISTORY:  His mother died of breast cancer.  His father died of an aneurysm.  PHYSICAL EXAMINATION:  VITALS:  Temperature 97.0, BP 105/66, respiratory rate 18, SAO2 greater than 96 on room air.   GENERAL:  He does have an occasional grimace  with pain but he is in no distress and pleasant and alert.  HEENT:  TMs are gray.  He has dentures, upper and lower.  NECK:  No JVD.  No lymphadenopathy.  No thyromegaly.  No bruit.  CHEST:  Lungs are clear to auscultation bilaterally.  CV:  Regular with no murmurs.  PMI is small.  ABDOMEN:  Decreased bowel sounds.  Soft.  Epigastric tenderness to mild palpation.  Nausea with movement.  Liver enlargement.  EXTREMITIES:  No edema.  Warm.  Good pulses.  NEUROLOGIC:  Nonfocal.  RECTAL:  Good tone.  Small prostate.  Heme-negative.  LABORATORY AND ACCESSORY DATA:  BMET significant for creatinine of 1.1, potassium 3.9, glucose 120.  CBC:  White count 10.4, hemoglobin 13.2.  AST 90, ALT 45, total bilirubin 0.6, albumin 3.7, lipase 1600, amylase 793.  CT shows acute pancreatitis with edema of the head of the pancreas.  ASSESSMENT AND PLAN:  [age]-year-old Caucasian male with acute pancreatitis.  Differential includes alcohol induced, given his AST/ALT ratio of 2:1, gallstones, medications, but unlikely idiopathic.  He meets only one-of-five Ranson criteria at this point for mortality, so he is at low risk. We will recheck him in 48 hours.  For now, admit, n.p.o., intravenous Protonix, morphine patient-controlled analgesic, Phenergan for nausea.  Will suspect secondary to alcohol and  follow clinically.  This case has been discussed with Dr. Huey Bienenstock. McDiarmid. Dictated by:   Kevin Fenton, M.D. Attending Physician:  McDiarmid, Tawanna Cooler D. DD:  11/07/01 TD:  11/08/01 Job: [id number]

## 2010-10-31 NOTE — Op Note (Signed)
[place]  Patient:    TREXTON, ESCAMILLA Visit Number: [id number] MRN: [id number]          Service Type: MED Location: [phone] Attending Physician:  McDiarmid, Leighton Roach. Dictated by:   Chevis Pretty, M.D. Proc. Date: 11/15/01 Admit Date:  11/07/2001 Discharge Date: 11/16/2001                             Operative Report  PREOPERATIVE DIAGNOSIS:  Gallstone pancreatitis.  POSTOPERATIVE DIAGNOSIS:  Gallstone pancreatitis.  PROCEDURE:  Laparoscopic cholecystectomy with intraoperative cholangiogram.  SURGEON:  Chevis Pretty, M.D.  ASSISTANT:  Anselm Pancoast. Zachery Dakins, M.D.  ANESTHESIA:  General endotracheal.  PROCEDURE:  After an informed consent was obtained, the patient was brought to the operating room and placed in the supine position on the operating room table.  After adequate induction of general anesthesia, the patients abdomen was prepped with Betadine and draped in the usual sterile fashion.  The area below the umbilicus was infiltrated with 0.25% Marcaine, and a small vertical incision was made with a 15 blade knife.  This incision was carried down through the subcutaneous tissue and blunt dissection with the Kelly clamp and Army-[employer] retractors until the linea alba was identified.  The linea alba was also incised with a 15 blade knife, and each side was grasped with Kocher clamps and elevated anteriorly.  The preperitoneal space was then probed bluntly with a hemostat until the peritoneum was opened, and access was gained to the abdominal cavity.  A finger was inserted through his opening, and there were no obvious adhesions to the anterior abdominal wall.  A 0 Vicryl pursestring stitch was placed in the fascia surrounding this opening.  A Hasson cannula was then placed through this opening and anchored in place with the previously placed Vicryl pursestring stitch.  The abdomen was then insufflated with carbon dioxide without difficulty.  The  laparoscope was inserted through this opening and the liver edge and dome of the gallbladder readily identifiable.  There seemed to be a mass centrally in the abdomen that was pushing up on the stomach, which was felt to possibly be secondary to the pancreatitis or pancreatic pseudocyst, but this area was not in the way of visualization of the gallbladder. Next, the small transverse upper midline incision was made after infiltrating this area with 0.25% Marcaine, and a 10 mm port was placed bluntly through this incision into the abdominal cavity. ______ laterally on the right side of the abdomen for placement of 5 mm ports, and each of these areas was infiltrated with 0.25% Marcaine, and small stab incisions were made with a 15 blade knife.  5 mm ports were then placed bluntly through each incision into the abdominal cavity under direct vision. A blunt grasper was placed through the lateralmost 5 mm port and used to grasp the dome of the gallbladder and elevated anteriorly and superiorly.  A dissector was placed through the upper midline port, and using a combination of blunt dissection and some sharp dissection with the electrocautery, the adhesions to the bed of the gallbladder were taken down.  Another blunt grasper was placed through the other 5 mm port and used to retract on the body and neck of the gallbladder.  The peritoneal reflection at the area of the gallbladder neck was then opened using the electrocautery, and blunt dissection was then carried out in this area until the gallbladder neck/cystic duct junction  was readily identified.  This was dissected in a circumferential manner, creating a nice window.  A clip was then placed distally at the gallbladder neck/cystic duct junction area, and a small ductotomy was made on the cystic duct with the laparoscopic scissors.  A 14-gauge angiocath was then placed percutaneously through the anterior abdominal wall, and a ______ catheter  was placed through the angiocath and placed within the cystic duct and anchored in place with a clip.  A cholangiogram was obtained that showed no filling defects and good emptying of the contrast into the duodenum. The clip was then removed, as well as the catheter.  Three clips were placed proximally on the cystic duct, and the duct was then divided between the two sets of clips.  Posteriorly, the cystic artery was identified and was again dissected bluntly in a circumferential manner, creating a nice window.  Two clips were placed proximally and one distally on the artery, and the artery was divided between the two.  Next, laparoscopic cautery was used to separate the gallbladder from the liver bed prior to completely detaching the gallbladder from the liver bed.  The liver bed was inspected, and several small bleeding points were coagulated with the Bovie electrocautery until the bed was hemostatic.  The gallbladder was then detached the rest of the way from the liver bed with the hook electrocautery.  The camera was then moved to the upper midline port, and a gallbladder grasper was placed through the Hasson cannula. The gallbladder was removed through the infraumbilical port with the Hasson cannula without difficulty. The fascia was then closed with a previously placed Vicryl pursestring stitch. The abdomen was then irrigated with copious amounts of saline until the affluent was clear.  The ports were then all removed under direct vision and were hemostatic.  The gas was allowed to escape from the abdomen, and the skin incisions were closed with interrupted 4-0 Monocryl subcuticular stitches.  Benzoin and Steri-Strips were applied.  The patient tolerated the procedure well.  At the end of the case, all needle, sponge, and instrument counts were correct.  The patient was awakened and taken to the recovery room in a stable condition. Dictated by:   Chevis Pretty, M.D. Attending Physician:   McDiarmid, Tawanna Cooler D. DD:  11/23/01 TD:  11/25/01 Job: [id number]

## 2010-10-31 NOTE — Op Note (Signed)
[name], Alex [name] NO.:  [id number]   MEDICAL RECORD NO.:  [id number]          PATIENT TYPE:  INP   LOCATION:  3306                         FACILITY:  [facility]   PHYSICIAN:  Evelene Croon, M.D.     DATE OF BIRTH:  June 30, 1932   DATE OF PROCEDURE:  03/09/2006  DATE OF DISCHARGE:                                 OPERATIVE REPORT   PREOPERATIVE DIAGNOSIS:  Right upper lobe lung cancer.   POSTOPERATIVE DIAGNOSIS:  Right upper lobe lung cancer.   OPERATIVE PROCEDURE:  1. Right video-assisted thoracoscopy, right thoracotomy with wedge      resection of right upper lobe lung cancer and insertion of radioactive      seeds.  2. On-Q pain catheter insertion.   ATTENDING SURGEON:  Evelene Croon, M.D.   ASSISTANT:  Zadie Rhine, P.A.-C   ANESTHESIA:  General endotracheal.   CLINICAL HISTORY:  This patient is a 77 year old gentleman with history of  previous smoking until 2002, who was admitted to [facility] in July 2007  with a 3-week history of night sweats, fatigue, and palpitations, as well as  increasing fatigue and diaphoresis.  He underwent cardiac workup by [facility]  Cardiology and had a negative Cardiolite scan.  He ruled out for myocardial  infarction.  Chest x-ray on admission showed a right upper lobe lung  densities well as severe COPD.  This had not been seen on his previous chest  x-ray from 2003.  He underwent CT scan on July 19 that showed a 2.6 x 1.9-cm  spiculated mass in the posterior aspect of the right upper lobe, worrisome  for bronchogenic carcinoma.  He also underwent a PET scan which showed a low-  level FDG uptake within the right upper lobe lung mass with a maximum SUV of  1.8.  There was no other abnormal uptake within the mediastinal or hilar  lymph nodes.  His complaints that brought him in the hospital had completely  resolved.  He underwent pulmonary function testing which showed an FEV-1 of  1.46 that was 50% of predicted.  The CT scan  of the head was negative.  He  subsequently underwent a CT-guided needle biopsy of the right upper lobe  lesion, which showed non-small cell lung cancer consistent with  adenocarcinoma.  We discussed his case at [facility] and it  was felt that a wedge resection of the right upper lobe lung cancer with  insertion of radioactive seeds was the best treatment, since he was felt to  be a poor candidate for a right upper lobectomy, given his age and pulmonary  function testing.  He was seen in preoperative consultation by Dr. Margaretmary Dys of Radiation Oncology.  I discussed the operative procedure of right  thoracoscopy and thoracotomy with wedge resection and insertion of  radioactive seeds.  I discussed the alternatives, benefits, and risks  including bleeding, blood transfusion, infection, inability to completely  resect the lung cancer, a recurrence of lung cancer, and death.  He  understood and agreed to proceed.   OPERATIVE PROCEDURE:  The patient was  taken to the operating room and placed  on the table in a supine position.  After induction of general endotracheal  anesthesia using a double-lumen endotracheal tube, the patient was  positioned in the left lateral decubitus position with the right side up.  The right side of the chest prepped and draped in the usual sterile manner.  A time-out was taken and the proper patient, proper operative side and  proper operation were confirmed with nursing and anesthesia staff.  Then a 1-  cm incision was made in the midaxillary line in approximately the 8th  intercostal space.  The 10-mm trocar was inserted and the 30-degree  thoracoscope was inserted.  Examination of the right upper lobe showed the  lesion in the posterior aspect.  There were significant adhesions present  from his previous needle biopsy so the right upper lobe had not completely  collapsed down.  I was able to examine the pleural space and did not see any   other lesions present on the visceral or parietal pleura.   Then a short right lateral thoracotomy incision was made and carried down  through the latissimus muscle using electrocautery.  The serratus muscle was  retracted and not divided.  The pleural space was entered through the 5th  intercostal space.  Examination of the pleural space showed no other  lesions.  The lung mass was palpable in the posterior aspect of the right  upper lobe adjacent to the major fissure.  A wedge resection was performed  resecting as much of the normal-appearing tissue around the lesion as  possible down to the major fissure.  The lesion was grossly completely  resected with a margin of normal-appearing tissue around it.  I did not feel  that any further resection would be possible without doing a lobectomy.  The  specimen sent to Pathology for frozen section, which was felt to be  consistent with bronchoalveolar cell carcinoma.  Dr. Clelia Croft felt that there  was a microscopic focus of adenocarcinoma at the resection margin.  I did  not feel that the patient would be a candidate for completion lobectomy and  therefore no further resection was performed.  I did not feel that any  further tissue could be wedged, since I was right down to the interlobar  fissure.  Then Dr. Kathrynn Running brought the Vicryl mesh with radioactive seeds up  on the operative field.  This was positioned over the staple line and  completely covered the staple line.  It was affixed to the lung in multiple  locations using interrupted Prolene sutures.   Then a 28-French chest tube was brought through the separate incision at the  8th intercostal space and inserted into the posterior pleural space up to  the apex.  It was affixed to the skin with a silk suture.   Then an On-Q pain catheter was inserted.  A small stab incision was made posteriorly and the introducer and sheath were inserted into a subpleural  location so that the catheter  crossed the intercostal space at the site of  the thoracotomy for 2 interspaces above the thoracotomy incision.  The  catheter was inserted through the sheath and the sheath removed.  The  catheter was affixed to the skin with a silk suture.  It was primed with 5  mL of 0.5% Marcaine and then connected to the On-Q pain pump.  Then the ribs  were reapproximated with interrupted #2 Vicryl pericostal sutures.  The  latissimus muscle  was closed with a continuous #1 Vicryl suture.  The  subcutaneous tissue was closed with a continuous 2-0 Vicryl and the skin  with a 3-0 Vicryl subcuticular closure.  Sponge, needles and instrument  counts were correct according to the scrub nurse.  A dry sterile dressing  was applied over the incision and around the chest tube, which was hooked to  Pleur-evac suction.  The patient remained hemodynamically stable and was  turned in the supine position, extubated, and transported to the  postanesthesia care unit in satisfactory and stable condition.      Evelene Croon, M.D.  Electronically Signed     BB/MEDQ  D:  03/09/2006  T:  03/11/2006  Job:  [id number]   cc:   Artist Pais Kathrynn Running, M.D.

## 2010-10-31 NOTE — Discharge Summary (Signed)
Alex [name] NO.:  [id number]   MEDICAL RECORD NO.:  [id number]          PATIENT TYPE:  INP   LOCATION:  2036                         FACILITY:  [facility]   PHYSICIAN:  Evelene Croon, M.D.     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   SECONDARY DIAGNOSES:  1. Chronic obstructive pulmonary disease.  2. Hyperlipidemia.  3. Gastroesophageal reflux disease.  4. History of esophageal stricture, status post dilatation.  5. History of peptic ulcer disease.  6. Remote history of alcohol abuse.  7. Status post cholecystectomy.   IN-HOSPITAL OPERATIONS AND PROCEDURES:  Right video-assisted thoracoscopic  surgery of the right thoracotomy, right upper lobe wedge resection with  radioactive seed implantation.   PATIENT'S HISTORY AND PHYSICAL AND HOSPITAL COURSE:  Alex [name] is a 77-  year-old gentleman with history of previous smoking until 2002.  He was  admitted to [facility] in July 2007 with a three-week history of night  sweats, fatigue and palpitations as well as increasing fatigue and  diaphoresis.  He underwent cardiac workup by [facility] and had a  negative Cardiolite scan pulled.  He ruled out for myocardial infarction.  Chest x-ray on admission showed a right upper lobe lung density as well as  severe COPD.  This had not been seen on previous chest x-ray from 2003.  He  underwent CT scan on December 22, 2005 that showed a 2.6 x 9 cm spiculated mass  in the posterior aspect of the right upper lobe significant for  bronchiogenic carcinoma.  The patient underwent a PET scan which showed a  low level STG uptake within the right upper lobe lung mass with some a  maximum SUV of 1.8.  There is no other abnormal uptake within the  mediastinal hilar lymph  nodes.  His complaints what brought him into the  hospital had completely resolved.  He underwent pulmonary function test  which showed an FEV-1 of 1.46 that was 50% predicted.  CT scan of the head  was negative.  He subsequently underwent CT guided needle biopsy of the  right upper lobe lesion which showed non-small cell lung cancer consistent  with adenocarcinoma.  The patient was seen and evaluated by Dr. Laneta Simmers.  Dr.  Laneta Simmers discussed with him undergoing a wedge resection of the lesion with  seed implantation.  He discussed risks and benefits.  The patient  acknowledged understanding agreed to proceed.  Surgery was scheduled for  March 09, 2006.   For details of the patient's past medical history and physical exam, please  see dictated history and physical.   The patient was taken to the operating room on March 09, 2006 where he  underwent right video-assisted thoracoscopic surgery with right thoracotomy,  wedge resection of right upper lobe with implantation radioactive seeds.  The patient tolerated this procedure well and was transferred up to  the  intensive care unit in stable condition.  The patient's postoperative course  was pretty much unremarkable.  He was seen to be stable immediately  postoperatively.  Chest x-ray was stable postop day #1 of right lower lobe  atelectasis.  No air leak noted.  Chest tube was placed to waterseal.  Postoperative day #2, chest x-ray showed improved aeration.  Chest tube was  discontinued postop day #2.  Following removal of chest tube, chest x-ray  was obtained and showed to be stable with no pneumothorax.  Vital signs were  monitored during his postoperative case.  Vital Signs were seen to be  stable.  He was seen to be afebrile.  The patient was able to be weaned off  oxygen sating greater than 90% on room air.   Postoperatively, the patient was seen to be hemodynamically stable with  hemoglobin/hematocrit 10.8 and 31.6 postop day  #1.  He was out of bed  ambulating well.  Creatinine was stable at 0.9.  The patient was seen to be  normal sinus rhythm.  Respiratory was clear to auscultation prior to  discharge.  Incisions were clean, dry and intact and healing well.   Patient is tentatively ready for discharge home postop day #3, March 12, 2006.   FOLLOW-UP APPOINTMENTS:  Follow-up appointment has been arranged with Dr.  Laneta Simmers for March 16, 2006 at 1:30 p.m.  The patient will need to obtain PMI  chest x-ray 1 hour prior to this appointment.   ACTIVITY:  Alex [name] was instructed no driving until released to do so, no  heavy lifting over 10 pounds.  He was told to ambulate 3-4 times per day,  progress as tolerated and continue his breathing exercises.   INCISIONAL CARE:  The patient was told to shower, wash his incisions using  soap and water.  He is to contact the office if he develops any drainage or  opening from any of his incision sites.   DIET:  The patient was instructed on diet to be low-fat, low-salt.   DISCHARGE MEDICATIONS:  1. Percocet 5/325 1-2 tablets q.4-6h. p.r.n. pain.  2. Toprol XL 25 mg b.i.d.  3. Zocor 40 mg q.h.s.  4. Nexium 40 mg daily.  5. Xanax 0.5 mg t.i.d. 1 tablet at night.  6. Multivitamin daily.  7. Albuterol inhaler as used at home.      Alex [name], [place]      Evelene Croon, M.D.  Electronically Signed    KMD/MEDQ  D:  03/11/2006  T:  03/13/2006  Job:  [id number]   cc:   Evelene Croon, M.D.

## 2010-10-31 NOTE — Consult Note (Signed)
[name], Alex NO.:  [id number]   MEDICAL RECORD NO.:  [id number]          PATIENT TYPE:  INP   LOCATION:  3705                         FACILITY:  [facility]   PHYSICIAN:  Arvilla Meres, M.D. LHCDATE OF BIRTH:  [birth year]   DATE OF CONSULTATION:  01/01/2006  DATE OF DISCHARGE:                                   CONSULTATION   REASON FOR CONSULTATION:  Palpitations and diaphoresis.   HISTORY OF PRESENT ILLNESS:  Alex [name] is a very pleasant 77 year old male  with a history of COPD and previous gallstone pancreatitis status post  cholecystectomy but no known coronary artery disease.  He tells me that  several nights ago he was sitting on the couch and developed palpitations.  These are associated with diaphoresis.  He went to the kitchen and tried to  eat but he felt too nauseated.  This lasted about five or ten minutes.  Subsequently he went back to the kitchen and was able to eat dinner.  Yesterday morning he woke up again with night sweats and again had some  palpitations.  He went to his primary care physician and an EKG was obtained  which was reportedly different from previous and he was sent to the  emergency room for further evaluation.  He denies any chest pain.  He has  had some chronic left shoulder and arm pain which he says is no worse.  He  does note some fatigue but says that he has remained fairly active and can  ride his bike for short spurts without any problem.  He has not had any  heart failure symptoms.  Workup so far has shown four sets of cardiac  enzymes which were normal.  His EKGs have been normal.  He had an  echocardiogram today which showed an EF of 65% with no significant regional  wall motion abnormalities.  He did undergo a chest x-ray which showed a  right upper lobe mass and this was followed by a chest CT which showed a 2.6  cm right upper lobe mass concerning for bronchogenic carcinoma.   REVIEW OF SYSTEMS:  Notable for  palpitations, nausea, and sweats as  described above.  All other systems are negative except for HPI and problem  list.   PAST MEDICAL HISTORY:  1.  COPD.  2.  Hyperlipidemia, recent diagnosis.  3.  History of gallstone pancreatitis status post laparoscopic      cholecystectomy in 2003.  4.  Gastroesophageal reflux disease.  5.  Esophageal stricture status post dilatation.  6.  Peptic ulcer disease.  7.  Previous history of alcohol use.   CURRENT MEDICATIONS:  Nexium, aspirin, multivitamin.   ALLERGIES:  NO KNOWN DRUG ALLERGIES.   SOCIAL HISTORY:  He lives in [place] with his wife.  He is a retired Scientist, water quality.  Tobacco greater than 100 pack year history.  Quit in 2003.  Alcohol  he drinks four to five beers a day.   FAMILY HISTORY:  Notable for a mother who died from breast cancer, father  died from an aneurysm.  There is  no history of premature coronary artery  disease.   PHYSICAL EXAMINATION:  On physical exam he is in no acute distress, lying  flat in bed.  Blood pressure 139/77, heart rate 86, temperature is 97.8, satting 97% on  room air.  HEENT:  Sclerae anicteric, EOMI.  There is no xanthelasma.  Mucous membranes  are moist.  NECK:  Supple.  There is no JVD.  JVP is about 7 to 8 cm of water.  Carotids  are 2+ bilaterally without any bruits.  There is no lymphadenopathy,  thyromegaly.  CARDIAC:  Very distant heart sounds but he is regular.  No obvious murmurs,  rubs, or gallops.  LUNGS:  Very poor air movement but no wheezes or rales.  ABDOMEN:  Soft; he is moderately tender in the epigastrium with no rebound  or guarding.  There are good bowel sounds.  EXTREMITIES are warm with no cyanosis, clubbing, or edema.  NEURO:  He is alert and oriented x3.  Cranial nerves II through XII are  intact.  He moves all four extremities without difficulty.   CHEST X-RAY:  Chest x-ray shows a right upper lobe mass found to be a lung  cancer.   EKG:  Shows no sinus rhythm, a  rate of 69, no ST-T wave changes.  I have  looked through all of his EKGs and have not found any abnormalities.  TSH is  normal at 0.743.   LABORATORY DATA:  White count 8.3, hemoglobin 11.8, platelets 208, sodium  136, potassium 4.0, BUN of 3, creatinine 0.9, glucose 101, CK is 42, MB is  1.1, troponin is 0.03.  There have been three sets of point of care markers  which are negative.  Total cholesterol is 213.  Triglycerides are 82, HDL is  46, and LDL is 151.  A telemetry shows sinus rhythm with periods of abrupt  sinus tachycardia with a heart rate up to 110 or 120.  There is no  arrhythmia.   ASSESSMENT:  1.  Palpitation/night sweats with negative cardiac enzymes and EKGs.  2.  COPD.  3.  Right upper lobe mass, newly diagnosed, likely lung cancer.  4.  Normal echocardiogram.  5.  History of pancreatitis with ongoing alcohol use.   DISCUSSION/PLAN:  The etiology of Alex [name]'s symptoms are unclear but  certainly myocardial ischemia and tachy palpitations are in the  differential, however, I think it also could be GI or related to his  malignancy.  Currently there are no objective signs of ischemia and given  that he would not be a bypass candidate with his lung cancer, I would favor  starting with Adenosine Myoview to further risk stratify.  Also continue to  observe on telemetry.   RECOMMENDATIONS:  1.  Aspirin 81 mg a day.  2.  Lopressor 25 mg p.o. b.i.d.  3.  Adenosine Myoview.  4.  Start Zocor 40 mg a day.  5.  Evaluate the epigastric pain to rule out pancreatitis.  6.  Watch closely for alcohol withdrawal.   We will follow with you.  Thank you for the consult.  Please call if  questions.     Arvilla Meres, M.D. [facility]  Electronically Signed    DB/MEDQ  D:  01/01/2006  T:  01/01/2006  Job:  [id number]

## 2010-10-31 NOTE — Discharge Summary (Signed)
[place]  Patient:    Alex [name], Alex [name] Visit Number: [id number] MRN: [id number]          Service Type: MED Location: [phone] Attending Physician:  McDiarmid, Leighton Roach. Dictated by:   Ebbie Ridge, M.D. Admit Date:  11/07/2001 Discharge Date: 11/16/2001   CC:         Ollen Gross. Vernell Morgans, M.D.   Discharge Summary  DISCHARGE DIAGNOSES: 1. Acute gallstone pancreatitis. 2. Cholelithiasis, status post cholecystectomy. 3. Alcohol use. 4. History of colon polyps, status post colonoscopy with polypectomy    December 2002. 5. History of peptic ulcer disease. 6. Gastroesophageal reflux disease.  DISCHARGE MEDICATIONS: 1. Aciphex. 2. Multivitamin. 3. Vicodin p.r.n. twenty given.  DISCHARGE INSTRUCTIONS: 1. No heavy lifting. 2. Avoid alcohol for two to three weeks. 3. May shower on Thursday. 4. Call doctor for temperature greater than 101 or if develops any    redness, drainage, or swelling from incision site.  FOLLOW-UP: 1. Call Dr. Caralyn Guile office at [phone] for follow-up visit in two weeks. 2. Follow-up with regular doctor in two to three weeks.  PROCEDURES: 1. Nov 07, 2001 - CT of abdomen and pelvis revealed an inflammatory process    surrounding the pancreas, consistent with acute pancreatitis. There was    also a small amount of fluid along the right paracolic gutter. Slight    thickening of the gallbladder wall was noted. 2. Nov 10, 2001 - Complete abdominal ultrasound revealed cholelithiasis with    mild gallbladder wall thickening and a tiny amount of pericholecystic    fluid. There was no biliary dilatation. 3. Nov 11, 2001 - A HIDA scan revealed no evidence of cystic duct obstruction    or biliary obstruction, and patient had normal gallbladder activity. 4. November 15, 2001 - A laparoscopic cholecystectomy with intraoperative cholangio-    gram, which was negative. 5. November 16, 2001 - CT of the abdomen and pelvis revealed  peripancreatic fluid    but no pseudocyst.  HISTORY OF PRESENT ILLNESS: Briefly, Alex [name] is a 77 year old Caucasian male who presented with epigastric and left upper quadrant abdominal pain, associated with diaphoresis, chills, nausea, and vomiting. Amylase and lipase were found to be elevated and CT scan was consistent with acute pancreatitis.  HOSPITAL COURSE:  #1 - ACUTE PANCREATITIS: The patient was treated with IV fluids, made NPO, and treated with a Morphine PCA pump until his pain resolved. His diet was then advanced without problems. His pancreatitis was felt to be secondary to alcoholism versus gallstones. Serum amylase was 793 and serum lipase was 1600 on admission. On discharge, amylase was 85.  #2 - CHOLELITHIASIS: The patient did have gallstones with some mild gallbladder wall thickening and pericholecystic fluid. He also was febrile with significant left upper quadrant pain during the first few days of his admission. This may have been acute cholecystitis versus gallstone pancreatitis. Regardless, his temperature and white count slowly resolved without antibiotics. On November 15, 2001, the patient had a laparoscopic cholecystectomy, which he tolerated without complications. At the time of discharge, the patient was afebrile with a total bilirubin of 0.5, alkaphos of 141, AST of 39, and ALT of 32. His pain was well controlled with Vicodin. A postop CT scan to evaluate a large mass seen in the central abdomen was negative for large pseudocyst.  DISPOSITION: The patient was discharged to follow-up with Dr. Carolynne Edouard in two weeks.  LABORATORY DATA: Blood cultures were negative times two. Fasting lipid profile revealed  total cholesterol of 134, triglycerides 25, HDL cholesterol 59, and LDL cholesterol 70. Dictated by:   Ebbie Ridge, M.D. Attending Physician:  McDiarmid, Leighton Roach DD:    /  / TD:  11/18/01 Job: [id number]

## 2010-11-06 ENCOUNTER — Encounter: Payer: Self-pay | Admitting: Nurse Practitioner

## 2012-11-04 ENCOUNTER — Other Ambulatory Visit: Payer: Self-pay | Admitting: *Deleted

## 2012-11-04 MED ORDER — CETIRIZINE HCL 10 MG PO TABS: 10.0000 mg | ORAL_TABLET | Freq: Every day | ORAL | Status: DC

## 2012-11-04 MED ORDER — MEMANTINE HCL 10 MG PO TABS: 10.0000 mg | ORAL_TABLET | Freq: Two times a day (BID) | ORAL | Status: DC

## 2013-01-05 ENCOUNTER — Other Ambulatory Visit: Payer: Self-pay

## 2013-01-05 MED ORDER — SIMVASTATIN 20 MG PO TABS: 20.0000 mg | ORAL_TABLET | Freq: Every day | ORAL | Status: DC

## 2013-01-05 NOTE — Telephone Encounter (Signed)
Last lipid 1/14  MMM

## 2013-01-16 ENCOUNTER — Telehealth: Payer: Self-pay | Admitting: Nurse Practitioner

## 2013-01-16 ENCOUNTER — Ambulatory Visit (INDEPENDENT_AMBULATORY_CARE_PROVIDER_SITE_OTHER): Payer: Medicare Other | Admitting: General Practice

## 2013-01-16 VITALS — BP 160/78 | HR 66 | Temp 97.8°F | Ht 67.0 in | Wt 154.0 lb

## 2013-01-16 DIAGNOSIS — J322 Chronic ethmoidal sinusitis: Secondary | ICD-10-CM

## 2013-01-16 MED ORDER — AZITHROMYCIN 250 MG PO TABS: ORAL_TABLET | ORAL | Status: DC

## 2013-01-16 NOTE — Patient Instructions (Addendum)

## 2013-01-16 NOTE — Telephone Encounter (Signed)
Pt already here as a wi-triage

## 2013-01-16 NOTE — Progress Notes (Signed)
  Subjective:    Patient ID: Alex [name], male    DOB: Sep 26, 1932, 79 y.o.   MRN: [id number]  HPI Patient present today with complaints of cough, headache, sinus pressure and nasal congestion. He reports onset was Friday and gradually worsened. He reports taking OTC cough medications without relief.     Review of Systems  Constitutional: Positive for chills. Negative for fever.  HENT: Positive for congestion, sore throat, rhinorrhea, postnasal drip and sinus pressure. Negative for ear pain, neck pain and neck stiffness.   Eyes: Negative for pain, discharge, redness and itching.  Respiratory: Positive for chest tightness. Negative for cough and shortness of breath.   Cardiovascular: Negative for chest pain and palpitations.  Genitourinary: Negative for difficulty urinating.  Neurological: Positive for headaches. Negative for dizziness and weakness.       Objective:   Physical Exam  Constitutional: He is oriented to person, place, and time. He appears well-developed and well-nourished.  HENT:  Head: Normocephalic and atraumatic.  Right Ear: External ear normal.  Left Ear: External ear normal.  Nose: Right sinus exhibits maxillary sinus tenderness and frontal sinus tenderness. Left sinus exhibits maxillary sinus tenderness and frontal sinus tenderness.  Mouth/Throat: Oropharynx is clear and moist.  Eyes: Conjunctivae and EOM are normal. Pupils are equal, round, and reactive to light.  Neck: Normal range of motion. Neck supple. No thyromegaly present.  Cardiovascular: Normal rate, regular rhythm and normal heart sounds.   Pulmonary/Chest: Effort normal and breath sounds normal. No respiratory distress. He exhibits no tenderness.  Lymphadenopathy:    He has no cervical adenopathy.  Neurological: He is alert and oriented to person, place, and time.  Skin: Skin is warm and dry.  Psychiatric: He has a normal mood and affect.          Assessment & Plan:  1. Ethmoid sinusitis -  azithromycin (ZITHROMAX) 250 MG tablet; Take as directed  Dispense: 6 tablet; Refill: 0 -Increase fluid intake Motrin or tylenol OTC New toothbrush in 3 days Proper handwashing RTO if symptoms worsen or unresolved Patient verbalized understanding -Coralie Keens, FNP-C

## 2013-02-01 ENCOUNTER — Other Ambulatory Visit: Payer: Self-pay | Admitting: *Deleted

## 2013-02-01 MED ORDER — SIMVASTATIN 20 MG PO TABS: 20.0000 mg | ORAL_TABLET | Freq: Every day | ORAL | Status: DC

## 2013-02-01 MED ORDER — FLUTICASONE PROPIONATE 50 MCG/ACT NA SUSP: 2.0000 | Freq: Every day | NASAL | Status: DC

## 2013-02-01 MED ORDER — METOPROLOL TARTRATE 50 MG PO TABS: 50.0000 mg | ORAL_TABLET | Freq: Two times a day (BID) | ORAL | Status: DC

## 2013-03-15 ENCOUNTER — Ambulatory Visit (INDEPENDENT_AMBULATORY_CARE_PROVIDER_SITE_OTHER): Payer: Medicare Other

## 2013-03-15 DIAGNOSIS — Z23 Encounter for immunization: Secondary | ICD-10-CM

## 2013-04-04 ENCOUNTER — Other Ambulatory Visit: Payer: Self-pay

## 2013-04-04 MED ORDER — ESOMEPRAZOLE MAGNESIUM 40 MG PO CPDR: 40.0000 mg | DELAYED_RELEASE_CAPSULE | Freq: Every day | ORAL | Status: DC

## 2013-04-04 MED ORDER — METOPROLOL TARTRATE 50 MG PO TABS: 50.0000 mg | ORAL_TABLET | Freq: Two times a day (BID) | ORAL | Status: DC

## 2013-04-04 MED ORDER — SIMVASTATIN 20 MG PO TABS: 20.0000 mg | ORAL_TABLET | Freq: Every day | ORAL | Status: DC

## 2013-04-10 ENCOUNTER — Ambulatory Visit (INDEPENDENT_AMBULATORY_CARE_PROVIDER_SITE_OTHER): Payer: Medicare Other | Admitting: General Practice

## 2013-04-10 ENCOUNTER — Encounter: Payer: Self-pay | Admitting: General Practice

## 2013-04-10 VITALS — BP 140/69 | HR 58 | Temp 97.2°F | Ht 67.0 in | Wt 153.0 lb

## 2013-04-10 DIAGNOSIS — J302 Other seasonal allergic rhinitis: Secondary | ICD-10-CM

## 2013-04-10 DIAGNOSIS — E785 Hyperlipidemia, unspecified: Secondary | ICD-10-CM

## 2013-04-10 DIAGNOSIS — I10 Essential (primary) hypertension: Secondary | ICD-10-CM

## 2013-04-10 DIAGNOSIS — F0390 Unspecified dementia without behavioral disturbance: Secondary | ICD-10-CM

## 2013-04-10 DIAGNOSIS — J309 Allergic rhinitis, unspecified: Secondary | ICD-10-CM

## 2013-04-10 DIAGNOSIS — K219 Gastro-esophageal reflux disease without esophagitis: Secondary | ICD-10-CM

## 2013-04-10 DIAGNOSIS — Z0000 Encounter for general adult medical examination without abnormal findings: Secondary | ICD-10-CM

## 2013-04-10 MED ORDER — SIMVASTATIN 20 MG PO TABS: 20.0000 mg | ORAL_TABLET | Freq: Every day | ORAL | Status: DC

## 2013-04-10 MED ORDER — MEMANTINE HCL 10 MG PO TABS: 10.0000 mg | ORAL_TABLET | Freq: Two times a day (BID) | ORAL | Status: DC

## 2013-04-10 MED ORDER — ESOMEPRAZOLE MAGNESIUM 40 MG PO CPDR: 40.0000 mg | DELAYED_RELEASE_CAPSULE | Freq: Every day | ORAL | Status: DC

## 2013-04-10 MED ORDER — METOPROLOL TARTRATE 50 MG PO TABS: 50.0000 mg | ORAL_TABLET | Freq: Two times a day (BID) | ORAL | Status: DC

## 2013-04-10 MED ORDER — CETIRIZINE HCL 10 MG PO TABS: 10.0000 mg | ORAL_TABLET | Freq: Every day | ORAL | Status: DC

## 2013-04-10 MED ORDER — AMLODIPINE BESYLATE 5 MG PO TABS: 5.0000 mg | ORAL_TABLET | Freq: Every day | ORAL | Status: DC

## 2013-04-10 MED ORDER — FLUTICASONE PROPIONATE 50 MCG/ACT NA SUSP: 2.0000 | Freq: Every day | NASAL | Status: DC

## 2013-04-10 NOTE — Progress Notes (Signed)
  Subjective:    Patient ID: Alex [name], male    DOB: Jul 28, 1932, 79 y.o.   MRN: [id number]  HPI Patient presents today for follow up of chronic health conditions. He has a history of hypertension, hyperlipidemia, and GERD. He reports taking medications as prescribed.     Review of Systems  Constitutional: Negative for fever and chills.  Respiratory: Negative for chest tightness and shortness of breath.   Cardiovascular: Negative for chest pain and palpitations.  Gastrointestinal: Negative for vomiting, abdominal pain, diarrhea, constipation and blood in stool.  Genitourinary: Negative for dysuria, hematuria and difficulty urinating.  Neurological: Negative for dizziness, weakness and headaches.       Objective:   Physical Exam  Constitutional: He is oriented to person, place, and time. He appears well-developed and well-nourished.  HENT:  Head: Normocephalic and atraumatic.  Right Ear: External ear normal.  Left Ear: External ear normal.  Nose: Nose normal.  Mouth/Throat: Oropharynx is clear and moist.  Eyes: Conjunctivae and EOM are normal. Pupils are equal, round, and reactive to light.  Neck: Normal range of motion. Neck supple. No thyromegaly present.  Cardiovascular: Normal rate, regular rhythm and normal heart sounds.   Pulmonary/Chest: Effort normal and breath sounds normal. No respiratory distress. He exhibits no tenderness.  Abdominal: Soft. Bowel sounds are normal. He exhibits no distension. There is no tenderness.  Musculoskeletal: Normal range of motion.  Lymphadenopathy:    He has no cervical adenopathy.  Neurological: He is alert and oriented to person, place, and time.  Skin: Skin is warm and dry.  Psychiatric: He has a normal mood and affect.          Assessment & Plan:  1. Hypertension  - CMP14+EGFR - amLODipine (NORVASC) 5 MG tablet; Take 1 tablet (5 mg total) by mouth daily.  Dispense: 30 tablet; Refill: 3 - metoprolol (LOPRESSOR) 50 MG tablet;  Take 1 tablet (50 mg total) by mouth 2 (two) times daily.  Dispense: 60 tablet; Refill: 3  2. Hyperlipidemia  - NMR, lipoprofile - simvastatin (ZOCOR) 20 MG tablet; Take 1 tablet (20 mg total) by mouth at bedtime.  Dispense: 30 tablet; Refill: 3  3. Seasonal allergic rhinitis  - cetirizine (ZYRTEC) 10 MG tablet; Take 1 tablet (10 mg total) by mouth daily.  Dispense: 30 tablet; Refill: 11 - fluticasone (FLONASE) 50 MCG/ACT nasal spray; Place 2 sprays into the nose daily.  Dispense: 16 g; Refill: 3  4. Annual physical exam  - PSA, total and free  5. GERD (gastroesophageal reflux disease)  - esomeprazole (NEXIUM) 40 MG capsule; Take 1 capsule (40 mg total) by mouth daily before breakfast.  Dispense: 30 capsule; Refill: 3  6. Dementia  - memantine (NAMENDA) 10 MG tablet; Take 1 tablet (10 mg total) by mouth 2 (two) times daily.  Dispense: 30 tablet; Refill: 3 -Continue all current medications Labs pending, cmp, lipid panel F/u in 3 months Discussed exercise and diet  Patient verbalized understanding Coralie Keens, FNP-C

## 2013-04-10 NOTE — Patient Instructions (Signed)

## 2013-04-11 LAB — NMR, LIPOPROFILE
Cholesterol: 192 mg/dL (ref <200)
HDL Cholesterol by NMR: 51 mg/dL (ref >=40)
HDL Particle Number: 36 umol/L (ref >=30.5)
LDLC SERPL CALC-MCNC: 103 mg/dL — ABNORMAL HIGH (ref <100)
Small LDL Particle Number: 1154 nmol/L — ABNORMAL HIGH (ref <=527)
Triglycerides by NMR: 188 mg/dL — ABNORMAL HIGH (ref <150)

## 2013-04-11 LAB — CMP14+EGFR
AST: 20 IU/L (ref 0–40)
Albumin/Globulin Ratio: 1.6 (ref 1.1–2.5)
Albumin: 4.5 g/dL (ref 3.5–4.8)
BUN: 11 mg/dL (ref 8–27)
CO2: 27 mmol/L (ref 18–29)
Calcium: 9.4 mg/dL (ref 8.6–10.2)
Creatinine, Ser: 1.12 mg/dL (ref 0.76–1.27)
GFR calc non Af Amer: 63 mL/min/{1.73_m2} (ref >59)
Globulin, Total: 2.9 g/dL (ref 1.5–4.5)
Glucose: 97 mg/dL (ref 65–99)
Total Protein: 7.4 g/dL (ref 6.0–8.5)

## 2013-04-11 LAB — SPECIMEN STATUS REPORT

## 2013-04-12 LAB — PSA, TOTAL AND FREE: PSA, Free Pct: 30 %

## 2013-04-12 LAB — SPECIMEN STATUS REPORT

## 2013-07-27 ENCOUNTER — Ambulatory Visit (INDEPENDENT_AMBULATORY_CARE_PROVIDER_SITE_OTHER): Payer: Medicare Other

## 2013-07-27 ENCOUNTER — Ambulatory Visit (INDEPENDENT_AMBULATORY_CARE_PROVIDER_SITE_OTHER): Payer: Medicare Other | Admitting: Nurse Practitioner

## 2013-07-27 VITALS — BP 127/66 | HR 85 | Temp 99.0°F | Ht 67.0 in | Wt 159.0 lb

## 2013-07-27 DIAGNOSIS — J019 Acute sinusitis, unspecified: Secondary | ICD-10-CM

## 2013-07-27 DIAGNOSIS — F0390 Unspecified dementia without behavioral disturbance: Secondary | ICD-10-CM | POA: Insufficient documentation

## 2013-07-27 DIAGNOSIS — K219 Gastro-esophageal reflux disease without esophagitis: Secondary | ICD-10-CM | POA: Insufficient documentation

## 2013-07-27 DIAGNOSIS — R059 Cough, unspecified: Secondary | ICD-10-CM

## 2013-07-27 DIAGNOSIS — R05 Cough: Secondary | ICD-10-CM

## 2013-07-27 DIAGNOSIS — E785 Hyperlipidemia, unspecified: Secondary | ICD-10-CM | POA: Insufficient documentation

## 2013-07-27 DIAGNOSIS — I10 Essential (primary) hypertension: Secondary | ICD-10-CM | POA: Insufficient documentation

## 2013-07-27 MED ORDER — AZITHROMYCIN 250 MG PO TABS: ORAL_TABLET | ORAL | Status: DC

## 2013-07-27 NOTE — Progress Notes (Signed)
   Subjective:    Patient ID: Alex [name], male    DOB: Jan 03, 1933, 80 y.o.   MRN: [id number]  HPI patient in today c/o cough and congestion- some wheezing- low grade fever- started 2 days ago.    Review of Systems  Constitutional: Positive for fever and chills.  HENT: Positive for congestion, postnasal drip, rhinorrhea and sinus pressure. Negative for ear pain, sore throat and trouble swallowing.   Respiratory: Positive for cough, shortness of breath and wheezing.   Cardiovascular: Negative.   Gastrointestinal: Negative.   Neurological: Negative.   All other systems reviewed and are negative.       Objective:   Physical Exam  Constitutional: He is oriented to person, place, and time. He appears well-developed and well-nourished.  HENT:  Right Ear: Hearing, tympanic membrane, external ear and ear canal normal.  Left Ear: Hearing, tympanic membrane, external ear and ear canal normal.  Nose: Mucosal edema and rhinorrhea present. Right sinus exhibits maxillary sinus tenderness. Right sinus exhibits no frontal sinus tenderness. Left sinus exhibits maxillary sinus tenderness. Left sinus exhibits no frontal sinus tenderness.  Mouth/Throat: Uvula is midline, oropharynx is clear and moist and mucous membranes are normal.  Neck: Normal range of motion. Neck supple.  Cardiovascular: Normal rate, regular rhythm and normal heart sounds.   Pulmonary/Chest: Effort normal. He has wheezes.  diminished breath sounds left lower  Abdominal: Soft. Bowel sounds are normal.  Lymphadenopathy:    He has no cervical adenopathy.  Neurological: He is alert and oriented to person, place, and time.  Skin: Skin is warm and dry.  Psychiatric: He has a normal mood and affect. His behavior is normal. Judgment and thought content normal.   SAO@ 97 % room air- dropped to 92% with exertion. BP 127/66  Pulse 85  Temp(Src) 99 F (37.2 C) (Oral)  Ht 5\' 7"  (1.702 m)  Wt 159 lb (72.122 kg)  BMI 24.90  kg/m2  Chest x ray- negative-Preliminary reading by Ronnald Collum, FNP  [facility]        Assessment & Plan:   1. Cough   2. Acute rhinosinusitis    Meds ordered this encounter  Medications  . azithromycin (ZITHROMAX Z-PAK) 250 MG tablet    Sig: As directed    Dispense:  6 each    Refill:  0    Order Specific Question:  Supervising Provider    Answer:  Joycelyn Man  Hold cholesterol meds while on antibiotic 1. Take meds as prescribed 2. Use a cool mist humidifier especially during the winter months and when heat has been humid. 3. Use saline nose sprays frequently 4. Saline irrigations of the nose can be very helpful if done frequently.  * 4X daily for 1 week*  * Use of a nettie pot can be helpful with this. Follow directions with this* 5. Drink plenty of fluids 6. Keep thermostat turn down low 7.For any cough or congestion  Use plain Mucinex- regular strength or max strength is fine   * Children- consult with Pharmacist for dosing 8. For fever or aces or pains- take tylenol or ibuprofen appropriate for age and weight.  * for fevers greater than 101 orally you may alternate ibuprofen and tylenol every  3 hours. Mary-Margaret Hassell Done, FNP

## 2013-07-27 NOTE — Patient Instructions (Signed)

## 2013-08-03 ENCOUNTER — Other Ambulatory Visit: Payer: Self-pay

## 2013-08-03 DIAGNOSIS — I10 Essential (primary) hypertension: Secondary | ICD-10-CM

## 2013-08-03 MED ORDER — METOPROLOL TARTRATE 50 MG PO TABS: 50.0000 mg | ORAL_TABLET | Freq: Two times a day (BID) | ORAL | Status: DC

## 2013-08-31 ENCOUNTER — Other Ambulatory Visit: Payer: Self-pay | Admitting: General Practice

## 2013-09-29 ENCOUNTER — Other Ambulatory Visit: Payer: Self-pay | Admitting: General Practice

## 2013-09-29 ENCOUNTER — Other Ambulatory Visit: Payer: Self-pay | Admitting: Nurse Practitioner

## 2013-10-03 ENCOUNTER — Other Ambulatory Visit: Payer: Self-pay | Admitting: *Deleted

## 2013-10-03 MED ORDER — ESOMEPRAZOLE MAGNESIUM 40 MG PO CPDR: 40.0000 mg | DELAYED_RELEASE_CAPSULE | Freq: Every day | ORAL | Status: DC

## 2013-10-05 ENCOUNTER — Encounter: Payer: Self-pay | Admitting: Nurse Practitioner

## 2013-10-05 ENCOUNTER — Ambulatory Visit (INDEPENDENT_AMBULATORY_CARE_PROVIDER_SITE_OTHER): Payer: Medicare Other | Admitting: Nurse Practitioner

## 2013-10-05 VITALS — BP 131/66 | HR 50 | Temp 97.5°F | Ht 68.4 in | Wt 148.0 lb

## 2013-10-05 DIAGNOSIS — J302 Other seasonal allergic rhinitis: Secondary | ICD-10-CM

## 2013-10-05 DIAGNOSIS — E785 Hyperlipidemia, unspecified: Secondary | ICD-10-CM

## 2013-10-05 DIAGNOSIS — J309 Allergic rhinitis, unspecified: Secondary | ICD-10-CM | POA: Insufficient documentation

## 2013-10-05 DIAGNOSIS — I10 Essential (primary) hypertension: Secondary | ICD-10-CM

## 2013-10-05 DIAGNOSIS — K219 Gastro-esophageal reflux disease without esophagitis: Secondary | ICD-10-CM

## 2013-10-05 DIAGNOSIS — F0390 Unspecified dementia without behavioral disturbance: Secondary | ICD-10-CM

## 2013-10-05 MED ORDER — ESOMEPRAZOLE MAGNESIUM 40 MG PO CPDR: 40.0000 mg | DELAYED_RELEASE_CAPSULE | Freq: Every day | ORAL | Status: DC

## 2013-10-05 MED ORDER — SIMVASTATIN 20 MG PO TABS: 20.0000 mg | ORAL_TABLET | Freq: Every day | ORAL | Status: DC

## 2013-10-05 MED ORDER — AMLODIPINE BESYLATE 5 MG PO TABS: ORAL_TABLET | ORAL | Status: DC

## 2013-10-05 MED ORDER — MEMANTINE HCL 10 MG PO TABS: 10.0000 mg | ORAL_TABLET | Freq: Two times a day (BID) | ORAL | Status: DC

## 2013-10-05 MED ORDER — METOPROLOL TARTRATE 50 MG PO TABS: 50.0000 mg | ORAL_TABLET | Freq: Two times a day (BID) | ORAL | Status: DC

## 2013-10-05 MED ORDER — CETIRIZINE HCL 10 MG PO TABS: 10.0000 mg | ORAL_TABLET | Freq: Every day | ORAL | Status: DC

## 2013-10-05 MED ORDER — FLUTICASONE PROPIONATE 50 MCG/ACT NA SUSP: 2.0000 | Freq: Every day | NASAL | Status: DC

## 2013-10-05 NOTE — Progress Notes (Signed)
Subjective:    Patient ID: Alex [name], male    DOB: 1932/07/21, 80 y.o.   MRN: [id number]  Patient here today for follow up of chronic medical problems.  Hypertension This is a chronic problem. The current episode started more than 1 year ago. The problem is unchanged. The problem is controlled. Pertinent negatives include no blurred vision, chest pain, headaches, neck pain, palpitations, peripheral edema, shortness of breath or sweats. There are no associated agents to hypertension. Risk factors for coronary artery disease include male gender and dyslipidemia. Past treatments include beta blockers and calcium channel blockers. The current treatment provides significant improvement. There are no compliance problems.   Hyperlipidemia This is a chronic problem. The current episode started more than 1 year ago. The problem is uncontrolled. Recent lipid tests were reviewed and are high. He has no history of diabetes, hypothyroidism or obesity. Factors aggravating his hyperlipidemia include beta blockers. Pertinent negatives include no chest pain or shortness of breath. Current antihyperlipidemic treatment includes statins. The current treatment provides moderate improvement of lipids. There are no compliance problems.  Risk factors for coronary artery disease include dyslipidemia, hypertension and male sex.  GERD nexium keeps symptoms under control Dementia namenda daily- seems to be helping with his memory- he says it seems to really help him- no side effects ALlergic rhinitis Zyrtec and flonase as needed- helps a lot with allergies- only uses when they ar flared up.   Review of Systems  Eyes: Negative for blurred vision.  Respiratory: Negative for shortness of breath.   Cardiovascular: Negative for chest pain and palpitations.  Musculoskeletal: Negative for neck pain.  Neurological: Negative for headaches.       Objective:   Physical Exam  Constitutional: He is oriented to person,  place, and time. He appears well-developed and well-nourished.  HENT:  Head: Normocephalic.  Right Ear: External ear normal.  Left Ear: External ear normal.  Nose: Nose normal.  Mouth/Throat: Oropharynx is clear and moist.  Eyes: EOM are normal. Pupils are equal, round, and reactive to light.  Neck: Normal range of motion. Neck supple. No JVD present. No thyromegaly present.  Cardiovascular: Normal rate, regular rhythm, normal heart sounds and intact distal pulses.  Exam reveals no gallop and no friction rub.   No murmur heard. Pulmonary/Chest: Effort normal and breath sounds normal. No respiratory distress. He has no wheezes. He has no rales. He exhibits no tenderness.  Abdominal: Soft. Bowel sounds are normal. He exhibits no mass. There is no tenderness.  Musculoskeletal: Normal range of motion. He exhibits no edema.  Lymphadenopathy:    He has no cervical adenopathy.  Neurological: He is alert and oriented to person, place, and time. No cranial nerve deficit.  Skin: Skin is warm and dry.  Psychiatric: He has a normal mood and affect. His behavior is normal. Judgment and thought content normal.  BP 131/66  Pulse 50  Temp(Src) 97.5 F (36.4 C) (Oral)  Ht 5' 8.4" (1.737 m)  Wt 148 lb (67.132 kg)  BMI 22.25 kg/m2         Assessment & Plan:   1. Hypertension   2. Hyperlipidemia LDL goal < 100   3. GERD (gastroesophageal reflux disease)   4. Dementia   5. Seasonal allergic rhinitis   6. Allergic rhinitis    Orders Placed This Encounter  Procedures  . CMP14+EGFR  . NMR, lipoprofile   Meds ordered this encounter  Medications  . PROAIR HFA 108 (90 BASE) MCG/ACT inhaler  Sig:   . tadalafil (CIALIS) 5 MG tablet    Sig: Take 5 mg by mouth daily as needed for erectile dysfunction.  . memantine (NAMENDA) 10 MG tablet    Sig: Take 1 tablet (10 mg total) by mouth 2 (two) times daily.    Dispense:  30 tablet    Refill:  5    Order Specific Question:  Supervising Provider      Answer:  Chipper Herb [[id number]]  . simvastatin (ZOCOR) 20 MG tablet    Sig: Take 1 tablet (20 mg total) by mouth at bedtime.    Dispense:  30 tablet    Refill:  5    Order Specific Question:  Supervising Provider    Answer:  Chipper Herb [[id number]]  . metoprolol (LOPRESSOR) 50 MG tablet    Sig: Take 1 tablet (50 mg total) by mouth 2 (two) times daily.    Dispense:  60 tablet    Refill:  5    Order Specific Question:  Supervising Provider    Answer:  Chipper Herb [[id number]]  . cetirizine (ZYRTEC) 10 MG tablet    Sig: Take 1 tablet (10 mg total) by mouth daily.    Dispense:  30 tablet    Refill:  11    Order Specific Question:  Supervising Provider    Answer:  Chipper Herb [[id number]]  . fluticasone (FLONASE) 50 MCG/ACT nasal spray    Sig: Place 2 sprays into both nostrils daily.    Dispense:  16 g    Refill:  3    Order Specific Question:  Supervising Provider    Answer:  Chipper Herb [[id number]]  . amLODipine (NORVASC) 5 MG tablet    Sig: TAKE ONE (1) TABLET EACH DAY    Dispense:  30 tablet    Refill:  5    Order Specific Question:  Supervising Provider    Answer:  Chipper Herb [[id number]]  . esomeprazole (NEXIUM) 40 MG capsule    Sig: Take 1 capsule (40 mg total) by mouth daily.    Dispense:  30 capsule    Refill:  5    Order Specific Question:  Supervising Provider    Answer:  Chipper Herb [place] pending Health maintenance reviewed Diet and exercise encouraged Continue all meds Follow up  In 3 months   [place], FNP

## 2013-10-05 NOTE — Patient Instructions (Signed)

## 2013-10-06 LAB — CMP14+EGFR
A/G RATIO: 1.5 (ref 1.1–2.5)
ALK PHOS: 111 IU/L (ref 39–117)
ALT: 21 IU/L (ref 0–44)
AST: 30 IU/L (ref 0–40)
Albumin: 4.1 g/dL (ref 3.5–4.8)
BILIRUBIN TOTAL: 0.3 mg/dL (ref 0.0–1.2)
BUN / CREAT RATIO: 6 — AB (ref 10–22)
BUN: 7 mg/dL — ABNORMAL LOW (ref 8–27)
CO2: 28 mmol/L (ref 18–29)
CREATININE: 1.1 mg/dL (ref 0.76–1.27)
Calcium: 9.2 mg/dL (ref 8.6–10.2)
Chloride: 96 mmol/L — ABNORMAL LOW (ref 97–108)
GFR, EST AFRICAN AMERICAN: 73 mL/min/{1.73_m2} (ref >59)
GFR, EST NON AFRICAN AMERICAN: 64 mL/min/{1.73_m2} (ref >59)
Globulin, Total: 2.7 g/dL (ref 1.5–4.5)
Glucose: 97 mg/dL (ref 65–99)
Potassium: 5.2 mmol/L (ref 3.5–5.2)
SODIUM: 137 mmol/L (ref 134–144)
Total Protein: 6.8 g/dL (ref 6.0–8.5)

## 2013-10-06 LAB — NMR, LIPOPROFILE
Cholesterol: 156 mg/dL
HDL Cholesterol by NMR: 48 mg/dL
HDL Particle Number: 35.2 umol/L
LDL Particle Number: 937 nmol/L
LDL Size: 20.5 nm
LDLC SERPL CALC-MCNC: 65 mg/dL
LP-IR Score: 59 — ABNORMAL HIGH
Small LDL Particle Number: 392 nmol/L
Triglycerides by NMR: 213 mg/dL — ABNORMAL HIGH

## 2013-10-11 ENCOUNTER — Telehealth: Payer: Self-pay | Admitting: Family Medicine

## 2013-10-11 NOTE — Telephone Encounter (Signed)
Message copied by Waverly Ferrari on Wed Oct 11, 2013 10:04 AM ------      Message from: Chevis Pretty      Created: Fri Oct 06, 2013  3:07 PM       Kidney and liver function stable      LDL particle Numbers look good- trig are elevated- decrease sugar in diet- recheck in 3 months- if no better will need more meds ------

## 2014-03-20 ENCOUNTER — Ambulatory Visit (INDEPENDENT_AMBULATORY_CARE_PROVIDER_SITE_OTHER): Payer: Medicare Other

## 2014-03-20 DIAGNOSIS — Z23 Encounter for immunization: Secondary | ICD-10-CM

## 2014-04-02 ENCOUNTER — Other Ambulatory Visit: Payer: Self-pay | Admitting: Nurse Practitioner

## 2014-04-03 NOTE — Telephone Encounter (Signed)
Patient NTBS for follow up and lab work  

## 2014-04-03 NOTE — Telephone Encounter (Signed)
Last seen 10/05/13 MMM  Last lipid 10/06/13

## 2014-05-03 ENCOUNTER — Other Ambulatory Visit: Payer: Self-pay | Admitting: Nurse Practitioner

## 2014-05-03 NOTE — Telephone Encounter (Signed)
Patient has follow up scheduled for 05/31/14.  1 month refill given on meds.

## 2014-05-31 ENCOUNTER — Encounter: Payer: Self-pay | Admitting: Nurse Practitioner

## 2014-05-31 ENCOUNTER — Ambulatory Visit (INDEPENDENT_AMBULATORY_CARE_PROVIDER_SITE_OTHER): Payer: Medicare Other | Admitting: Nurse Practitioner

## 2014-05-31 VITALS — BP 144/81 | HR 78 | Temp 97.6°F | Ht 68.4 in | Wt 155.2 lb

## 2014-05-31 DIAGNOSIS — E785 Hyperlipidemia, unspecified: Secondary | ICD-10-CM

## 2014-05-31 DIAGNOSIS — I10 Essential (primary) hypertension: Secondary | ICD-10-CM

## 2014-05-31 DIAGNOSIS — W57XXXA Bitten or stung by nonvenomous insect and other nonvenomous arthropods, initial encounter: Secondary | ICD-10-CM

## 2014-05-31 DIAGNOSIS — K219 Gastro-esophageal reflux disease without esophagitis: Secondary | ICD-10-CM

## 2014-05-31 DIAGNOSIS — S70362A Insect bite (nonvenomous), left thigh, initial encounter: Secondary | ICD-10-CM

## 2014-05-31 DIAGNOSIS — F0390 Unspecified dementia without behavioral disturbance: Secondary | ICD-10-CM

## 2014-05-31 MED ORDER — TADALAFIL 5 MG PO TABS: 5.0000 mg | ORAL_TABLET | Freq: Every day | ORAL | Status: DC | PRN

## 2014-05-31 MED ORDER — SIMVASTATIN 20 MG PO TABS: 20.0000 mg | ORAL_TABLET | Freq: Every day | ORAL | Status: DC

## 2014-05-31 MED ORDER — METOPROLOL TARTRATE 50 MG PO TABS
50.0000 mg | ORAL_TABLET | Freq: Two times a day (BID) | ORAL | Status: DC
Start: 2014-05-31 — End: 2014-11-02

## 2014-05-31 MED ORDER — AMLODIPINE BESYLATE 5 MG PO TABS: ORAL_TABLET | ORAL | Status: DC

## 2014-05-31 MED ORDER — DOXYCYCLINE HYCLATE 100 MG PO TABS: 100.0000 mg | ORAL_TABLET | Freq: Two times a day (BID) | ORAL | Status: DC

## 2014-05-31 MED ORDER — ESOMEPRAZOLE MAGNESIUM 40 MG PO CPDR: 40.0000 mg | DELAYED_RELEASE_CAPSULE | Freq: Every day | ORAL | Status: DC

## 2014-05-31 MED ORDER — MEMANTINE HCL 10 MG PO TABS: 10.0000 mg | ORAL_TABLET | Freq: Two times a day (BID) | ORAL | Status: DC

## 2014-05-31 NOTE — Patient Instructions (Signed)
Tick Bite Information Ticks are insects that attach themselves to the skin and draw blood for food. There are various types of ticks. Common types include wood ticks and deer ticks. Most ticks live in shrubs and grassy areas. Ticks can climb onto your body when you make contact with leaves or grass where the tick is waiting. The most common places on the body for ticks to attach themselves are the scalp, neck, armpits, waist, and groin. Most tick bites are harmless, but sometimes ticks carry germs that cause diseases. These germs can be spread to a person during the tick's feeding process. The chance of a disease spreading through a tick bite depends on:   The type of tick.  Time of year.   How long the tick is attached.   Geographic location.  HOW CAN YOU PREVENT TICK BITES? Take these steps to help prevent tick bites when you are outdoors:  Wear protective clothing. Long sleeves and long pants are best.   Wear white clothes so you can see ticks more easily.  Tuck your pant legs into your socks.   If walking on a trail, stay in the middle of the trail to avoid brushing against bushes.  Avoid walking through areas with long grass.  Put insect repellent on all exposed skin and along boot tops, pant legs, and sleeve cuffs.   Check clothing, hair, and skin repeatedly and before going inside.   Brush off any ticks that are not attached.  Take a shower or bath as soon as possible after being outdoors.  WHAT IS THE PROPER WAY TO REMOVE A TICK? Ticks should be removed as soon as possible to help prevent diseases caused by tick bites. 1. If latex gloves are available, put them on before trying to remove a tick.  2. Using fine-point tweezers, grasp the tick as close to the skin as possible. You may also use curved forceps or a tick removal tool. Grasp the tick as close to its head as possible. Avoid grasping the tick on its body. 3. Pull gently with steady upward pressure until  the tick lets go. Do not twist the tick or jerk it suddenly. This may break off the tick's head or mouth parts. 4. Do not squeeze or crush the tick's body. This could force disease-carrying fluids from the tick into your body.  5. After the tick is removed, wash the bite area and your hands with soap and water or other disinfectant such as alcohol. 6. Apply a small amount of antiseptic cream or ointment to the bite site.  7. Wash and disinfect any instruments that were used.  Do not try to remove a tick by applying a hot match, petroleum jelly, or fingernail polish to the tick. These methods do not work and may increase the chances of disease being spread from the tick bite.  WHEN SHOULD YOU SEEK MEDICAL CARE? Contact your health care provider if you are unable to remove a tick from your skin or if a part of the tick breaks off and is stuck in the skin.  After a tick bite, you need to be aware of signs and symptoms that could be related to diseases spread by ticks. Contact your health care provider if you develop any of the following in the days or weeks after the tick bite:  Unexplained fever.  Rash. A circular rash that appears days or weeks after the tick bite may indicate the possibility of Lyme disease. The rash may resemble   a target with a bull's-eye and may occur at a different part of your body than the tick bite.  Redness and swelling in the area of the tick bite.   Tender, swollen lymph glands.   Diarrhea.   Weight loss.   Cough.   Fatigue.   Muscle, joint, or bone pain.   Abdominal pain.   Headache.   Lethargy or a change in your level of consciousness.  Difficulty walking or moving your legs.   Numbness in the legs.   Paralysis.  Shortness of breath.   Confusion.   Repeated vomiting.  Document Released: 05/29/2000 Document Revised: 03/22/2013 Document Reviewed: 11/09/2012 [employer] Patient Information 2015 [employer]. This information is  not intended to replace advice given to you by your health care provider. Make sure you discuss any questions you have with your health care provider.  

## 2014-05-31 NOTE — Progress Notes (Signed)
Subjective:    Patient ID: Alex [name], male    DOB: June 22, 1932, 80 y.o.   MRN: [id number]  Patient here today for follow up of chronic medical problems. Patient removed a tick from left inner tigh and not sure he got all of it- wants it looked at.  Hypertension This is a chronic problem. The current episode started more than 1 year ago. The problem is controlled. Pertinent negatives include no chest pain, headaches, neck pain, palpitations or shortness of breath. Risk factors for coronary artery disease include dyslipidemia and male gender. Past treatments include beta blockers and calcium channel blockers. The current treatment provides moderate improvement. Compliance problems include diet and exercise.   Hyperlipidemia This is a chronic problem. The current episode started more than 1 year ago. The problem is uncontrolled. Recent lipid tests were reviewed and are variable. Pertinent negatives include no chest pain or shortness of breath. Current antihyperlipidemic treatment includes statins. The current treatment provides moderate improvement of lipids. Compliance problems include adherence to diet and adherence to exercise.  Risk factors for coronary artery disease include dyslipidemia, hypertension and male sex.  GERD nexium keeps symptoms under control Dementia namenda daily- seems to be helping with his memory- he says it seems to really help him- no side effects ALlergic rhinitis Zyrtec and flonase as needed- helps a lot with allergies- only uses when they ar flared up. BPH cialis daily- helps with urinary flow.    Review of Systems  Constitutional: Negative.   HENT: Negative.   Respiratory: Negative for shortness of breath.   Cardiovascular: Negative for chest pain and palpitations.  Genitourinary: Negative.   Musculoskeletal: Negative for neck pain.  Neurological: Negative for headaches.  Psychiatric/Behavioral: Negative.   All other systems reviewed and are  negative.      Objective:   Physical Exam  Constitutional: He is oriented to person, place, and time. He appears well-developed and well-nourished.  HENT:  Head: Normocephalic.  Right Ear: External ear normal.  Left Ear: External ear normal.  Nose: Nose normal.  Mouth/Throat: Oropharynx is clear and moist.  Eyes: EOM are normal. Pupils are equal, round, and reactive to light.  Neck: Normal range of motion. Neck supple. No JVD present. No thyromegaly present.  Cardiovascular: Normal rate, regular rhythm, normal heart sounds and intact distal pulses.  Exam reveals no gallop and no friction rub.   No murmur heard. Pulmonary/Chest: Effort normal and breath sounds normal. No respiratory distress. He has no wheezes. He has no rales. He exhibits no tenderness.  Abdominal: Soft. Bowel sounds are normal. He exhibits no mass. There is no tenderness.  Genitourinary: Prostate normal and penis normal.  Musculoskeletal: Normal range of motion. He exhibits no edema.  Lymphadenopathy:    He has no cervical adenopathy.  Neurological: He is alert and oriented to person, place, and time. No cranial nerve deficit.  Skin: Skin is warm and dry.  erythemaotius scabbed area left upper thigh- head of tick removed with 25g needle.  Psychiatric: He has a normal mood and affect. His behavior is normal. Judgment and thought content normal.  BP 144/81 mmHg  Pulse 78  Temp(Src) 97.6 F (36.4 C) (Oral)  Ht 5' 8.4" (1.737 m)  Wt 155 lb 3.2 oz (70.398 kg)  BMI 23.33 kg/m2         Assessment & Plan:   1. Dementia, without behavioral disturbance - memantine (NAMENDA) 10 MG tablet; Take 1 tablet (10 mg total) by mouth 2 (two) times daily.  Dispense: 30 tablet; Refill: 5  2. Essential hypertension Do not add salt to diet - metoprolol (LOPRESSOR) 50 MG tablet; Take 1 tablet (50 mg total) by mouth 2 (two) times daily.  Dispense: 60 tablet; Refill: 5 - amLODipine (NORVASC) 5 MG tablet; TAKE ONE (1) TABLET  EACH DAY  Dispense: 30 tablet; Refill: 5 - CMP14+EGFR  3. Gastroesophageal reflux disease without esophagitis Avoid spicy foods - esomeprazole (NEXIUM) 40 MG capsule; Take 1 capsule (40 mg total) by mouth daily.  Dispense: 30 capsule; Refill: 5  4. Hyperlipidemia with target LDL less than 100 Low fat diet - simvastatin (ZOCOR) 20 MG tablet; Take 1 tablet (20 mg total) by mouth at bedtime.  Dispense: 30 tablet; Refill: 5 - NMR, lipoprofile  5. Tick bite of thigh, left, initial encounter Keep clean and dry Antibiotic ointment daily - doxycycline (VIBRA-TABS) 100 MG tablet; Take 1 tablet (100 mg total) by mouth 2 (two) times daily.  Dispense: 20 tablet; Refill: 0  Labs pending Health maintenance reviewed Diet and exercise encouraged Continue all meds Follow up  In 3 months   [place], FNP

## 2014-06-01 LAB — CMP14+EGFR
ALK PHOS: 90 IU/L (ref 39–117)
ALT: 15 IU/L (ref 0–44)
AST: 24 IU/L (ref 0–40)
Albumin/Globulin Ratio: 1.5 (ref 1.1–2.5)
Albumin: 4.4 g/dL (ref 3.5–4.8)
BILIRUBIN TOTAL: 0.5 mg/dL (ref 0.0–1.2)
BUN / CREAT RATIO: 10 (ref 10–22)
BUN: 10 mg/dL (ref 8–27)
CO2: 29 mmol/L (ref 18–29)
Calcium: 9.2 mg/dL (ref 8.6–10.2)
Chloride: 95 mmol/L — ABNORMAL LOW (ref 97–108)
Creatinine, Ser: 1.03 mg/dL (ref 0.76–1.27)
GFR calc non Af Amer: 69 mL/min/{1.73_m2} (ref >59)
GFR, EST AFRICAN AMERICAN: 79 mL/min/{1.73_m2} (ref >59)
Globulin, Total: 2.9 g/dL (ref 1.5–4.5)
Glucose: 94 mg/dL (ref 65–99)
Potassium: 4.6 mmol/L (ref 3.5–5.2)
SODIUM: 136 mmol/L (ref 134–144)
Total Protein: 7.3 g/dL (ref 6.0–8.5)

## 2014-06-01 LAB — NMR, LIPOPROFILE
CHOLESTEROL: 149 mg/dL (ref 100–199)
HDL Cholesterol by NMR: 52 mg/dL (ref >39)
HDL Particle Number: 34.9 umol/L (ref >=30.5)
LDL PARTICLE NUMBER: 711 nmol/L (ref <1000)
LDL SIZE: 20.4 nm (ref >20.5)
LDL-C: 68 mg/dL (ref 0–99)
LP-IR Score: 46 — ABNORMAL HIGH (ref <=45)
Small LDL Particle Number: 375 nmol/L (ref <=527)
Triglycerides by NMR: 144 mg/dL (ref 0–149)

## 2014-08-31 ENCOUNTER — Other Ambulatory Visit: Payer: Self-pay | Admitting: Nurse Practitioner

## 2014-08-31 ENCOUNTER — Ambulatory Visit (INDEPENDENT_AMBULATORY_CARE_PROVIDER_SITE_OTHER): Payer: Medicare Other | Admitting: Nurse Practitioner

## 2014-08-31 ENCOUNTER — Encounter: Payer: Self-pay | Admitting: Nurse Practitioner

## 2014-08-31 VITALS — BP 135/68 | HR 63 | Temp 97.0°F | Ht 68.0 in | Wt 156.0 lb

## 2014-08-31 DIAGNOSIS — E785 Hyperlipidemia, unspecified: Secondary | ICD-10-CM | POA: Diagnosis not present

## 2014-08-31 DIAGNOSIS — I10 Essential (primary) hypertension: Secondary | ICD-10-CM | POA: Diagnosis not present

## 2014-08-31 DIAGNOSIS — F0390 Unspecified dementia without behavioral disturbance: Secondary | ICD-10-CM

## 2014-08-31 DIAGNOSIS — K219 Gastro-esophageal reflux disease without esophagitis: Secondary | ICD-10-CM | POA: Diagnosis not present

## 2014-08-31 DIAGNOSIS — N400 Enlarged prostate without lower urinary tract symptoms: Secondary | ICD-10-CM | POA: Diagnosis not present

## 2014-08-31 NOTE — Patient Instructions (Signed)
Fat and Cholesterol Control Diet Fat and cholesterol levels in your blood and organs are influenced by your diet. High levels of fat and cholesterol may lead to diseases of the heart, small and large blood vessels, gallbladder, liver, and pancreas. CONTROLLING FAT AND CHOLESTEROL WITH DIET Although exercise and lifestyle factors are important, your diet is key. That is because certain foods are known to raise cholesterol and others to lower it. The goal is to balance foods for their effect on cholesterol and more importantly, to replace saturated and trans fat with other types of fat, such as monounsaturated fat, polyunsaturated fat, and omega-3 fatty acids. On average, a person should consume no more than 15 to 17 g of saturated fat daily. Saturated and trans fats are considered "bad" fats, and they will raise LDL cholesterol. Saturated fats are primarily found in animal products such as meats, butter, and cream. However, that does not mean you need to give up all your favorite foods. Today, there are good tasting, low-fat, low-cholesterol substitutes for most of the things you like to eat. Choose low-fat or nonfat alternatives. Choose round or loin cuts of red meat. These types of cuts are lowest in fat and cholesterol. Chicken (without the skin), fish, veal, and ground turkey breast are great choices. Eliminate fatty meats, such as hot dogs and salami. Even shellfish have little or no saturated fat. Have a 3 oz (85 g) portion when you eat lean meat, poultry, or fish. Trans fats are also called "partially hydrogenated oils." They are oils that have been scientifically manipulated so that they are solid at room temperature resulting in a longer shelf life and improved taste and texture of foods in which they are added. Trans fats are found in stick margarine, some tub margarines, cookies, crackers, and baked goods.  When baking and cooking, oils are a great substitute for butter. The monounsaturated oils are  especially beneficial since it is believed they lower LDL and raise HDL. The oils you should avoid entirely are saturated tropical oils, such as coconut and palm.  Remember to eat a lot from food groups that are naturally free of saturated and trans fat, including fish, fruit, vegetables, beans, grains (barley, rice, couscous, bulgur wheat), and pasta (without cream sauces).  IDENTIFYING FOODS THAT LOWER FAT AND CHOLESTEROL  Soluble fiber may lower your cholesterol. This type of fiber is found in fruits such as apples, vegetables such as broccoli, potatoes, and carrots, legumes such as beans, peas, and lentils, and grains such as barley. Foods fortified with plant sterols (phytosterol) may also lower cholesterol. You should eat at least 2 g per day of these foods for a cholesterol lowering effect.  Read package labels to identify low-saturated fats, trans fat free, and low-fat foods at the supermarket. Select cheeses that have only 2 to 3 g saturated fat per ounce. Use a heart-healthy tub margarine that is free of trans fats or partially hydrogenated oil. When buying baked goods (cookies, crackers), avoid partially hydrogenated oils. Breads and muffins should be made from whole grains (whole-wheat or whole oat flour, instead of "flour" or "enriched flour"). Buy non-creamy canned soups with reduced salt and no added fats.  FOOD PREPARATION TECHNIQUES  Never deep-fry. If you must fry, either stir-fry, which uses very little fat, or use non-stick cooking sprays. When possible, broil, bake, or roast meats, and steam vegetables. Instead of putting butter or margarine on vegetables, use lemon and herbs, applesauce, and cinnamon (for squash and sweet potatoes). Use nonfat   yogurt, salsa, and low-fat dressings for salads.  LOW-SATURATED FAT / LOW-FAT FOOD SUBSTITUTES Meats / Saturated Fat (g)  Avoid: Steak, marbled (3 oz/85 g) / 11 g  Choose: Steak, lean (3 oz/85 g) / 4 g  Avoid: Hamburger (3 oz/85 g) / 7  g  Choose: Hamburger, lean (3 oz/85 g) / 5 g  Avoid: Ham (3 oz/85 g) / 6 g  Choose: Ham, lean cut (3 oz/85 g) / 2.4 g  Avoid: Chicken, with skin, dark meat (3 oz/85 g) / 4 g  Choose: Chicken, skin removed, dark meat (3 oz/85 g) / 2 g  Avoid: Chicken, with skin, light meat (3 oz/85 g) / 2.5 g  Choose: Chicken, skin removed, light meat (3 oz/85 g) / 1 g Dairy / Saturated Fat (g)  Avoid: Whole milk (1 cup) / 5 g  Choose: Low-fat milk, 2% (1 cup) / 3 g  Choose: Low-fat milk, 1% (1 cup) / 1.5 g  Choose: Skim milk (1 cup) / 0.3 g  Avoid: Hard cheese (1 oz/28 g) / 6 g  Choose: Skim milk cheese (1 oz/28 g) / 2 to 3 g  Avoid: Cottage cheese, 4% fat (1 cup) / 6.5 g  Choose: Low-fat cottage cheese, 1% fat (1 cup) / 1.5 g  Avoid: Ice cream (1 cup) / 9 g  Choose: Sherbet (1 cup) / 2.5 g  Choose: Nonfat frozen yogurt (1 cup) / 0.3 g  Choose: Frozen fruit bar / trace  Avoid: Whipped cream (1 tbs) / 3.5 g  Choose: Nondairy whipped topping (1 tbs) / 1 g Condiments / Saturated Fat (g)  Avoid: Mayonnaise (1 tbs) / 2 g  Choose: Low-fat mayonnaise (1 tbs) / 1 g  Avoid: Butter (1 tbs) / 7 g  Choose: Extra light margarine (1 tbs) / 1 g  Avoid: Coconut oil (1 tbs) / 11.8 g  Choose: Olive oil (1 tbs) / 1.8 g  Choose: Corn oil (1 tbs) / 1.7 g  Choose: Safflower oil (1 tbs) / 1.2 g  Choose: Sunflower oil (1 tbs) / 1.4 g  Choose: Soybean oil (1 tbs) / 2.4 g  Choose: Canola oil (1 tbs) / 1 g Document Released: 06/01/2005 Document Revised: 09/26/2012 Document Reviewed: 08/30/2013 [employer] Patient Information 2015 [employer]. This information is not intended to replace advice given to you by your health care provider. Make sure you discuss any questions you have with your health care provider.  

## 2014-08-31 NOTE — Progress Notes (Signed)
Subjective:    Patient ID: Alex [name], male    DOB: [birth year], 81 y.o.   MRN: [id number]  Patient here today for follow up of chronic medical problems. No complaints today  Hypertension This is a chronic problem. The current episode started more than 1 year ago. The problem is controlled. Pertinent negatives include no chest pain, headaches, neck pain, palpitations or shortness of breath. Risk factors for coronary artery disease include dyslipidemia and male gender. Past treatments include beta blockers and calcium channel blockers. The current treatment provides moderate improvement. Compliance problems include diet and exercise.   Hyperlipidemia This is a chronic problem. The current episode started more than 1 year ago. The problem is uncontrolled. Recent lipid tests were reviewed and are variable. Pertinent negatives include no chest pain or shortness of breath. Current antihyperlipidemic treatment includes statins. The current treatment provides moderate improvement of lipids. Compliance problems include adherence to diet and adherence to exercise.  Risk factors for coronary artery disease include dyslipidemia, hypertension and male sex.  GERD nexium keeps symptoms under control Dementia namenda daily- seems to be helping with his memory- he says it seems to really help him- no side effects ALlergic rhinitis Zyrtec and flonase as needed- helps a lot with allergies- only uses when they ar flared up. BPH cialis daily- helps with urinary flow.    Review of Systems  Constitutional: Negative.   HENT: Negative.   Respiratory: Negative for shortness of breath.   Cardiovascular: Negative for chest pain and palpitations.  Genitourinary: Negative.   Musculoskeletal: Negative for neck pain.  Neurological: Negative for headaches.  Psychiatric/Behavioral: Negative.   All other systems reviewed and are negative.      Objective:   Physical Exam  Constitutional: He is oriented to  person, place, and time. He appears well-developed and well-nourished.  HENT:  Head: Normocephalic.  Right Ear: External ear normal.  Left Ear: External ear normal.  Nose: Nose normal.  Mouth/Throat: Oropharynx is clear and moist.  Eyes: EOM are normal. Pupils are equal, round, and reactive to light.  Neck: Normal range of motion. Neck supple. No JVD present. No thyromegaly present.  Cardiovascular: Normal rate, regular rhythm, normal heart sounds and intact distal pulses.  Exam reveals no gallop and no friction rub.   No murmur heard. Pulmonary/Chest: Effort normal and breath sounds normal. No respiratory distress. He has no wheezes. He has no rales. He exhibits no tenderness.  Abdominal: Soft. Bowel sounds are normal. He exhibits no mass. There is no tenderness.  Genitourinary: Prostate normal and penis normal.  Musculoskeletal: Normal range of motion. He exhibits no edema.  Lymphadenopathy:    He has no cervical adenopathy.  Neurological: He is alert and oriented to person, place, and time. No cranial nerve deficit.  Skin: Skin is warm and dry.  erythemaotius scabbed area left upper thigh- head of tick removed with 25g needle.  Psychiatric: He has a normal mood and affect. His behavior is normal. Judgment and thought content normal.  BP 135/68 mmHg  Pulse 63  Temp(Src) 97 F (36.1 C) (Oral)  Ht 5' 8"  (1.727 m)  Wt 156 lb (70.761 kg)  BMI 23.73 kg/m2  EKG- sinus bradycardia- Alex Hassell Done, FNP        Assessment & Plan:   1. Essential hypertension Do not add salt to diet - CMP14+EGFR - EKG 12-Lead  2. Gastroesophageal reflux disease without esophagitis Watch spicy foods  3. Hyperlipidemia with target LDL less than 100 Low fat diet -  NMR, lipoprofile  4. Dementia, without behavioral disturbance  5. BPH (benign prostatic hyperplasia) - PSA, total and free   No prevnar13 available- will discuss at next appointment Labs pending Health maintenance  reviewed Diet and exercise encouraged Continue all meds Follow up  In [place], FNP

## 2014-09-01 LAB — NMR, LIPOPROFILE
Cholesterol: 133 mg/dL (ref 100–199)
HDL CHOLESTEROL BY NMR: 52 mg/dL (ref >39)
HDL PARTICLE NUMBER: 33.6 umol/L (ref >=30.5)
LDL Particle Number: 679 nmol/L (ref <1000)
LDL Size: 20.7 nm (ref >20.5)
LDL-C: 55 mg/dL (ref 0–99)
LP-IR SCORE: 39 (ref <=45)
Small LDL Particle Number: 451 nmol/L (ref <=527)
TRIGLYCERIDES BY NMR: 131 mg/dL (ref 0–149)

## 2014-09-01 LAB — CMP14+EGFR
ALBUMIN: 4 g/dL (ref 3.5–4.7)
ALT: 11 IU/L (ref 0–44)
AST: 20 IU/L (ref 0–40)
Albumin/Globulin Ratio: 1.4 (ref 1.1–2.5)
Alkaline Phosphatase: 85 IU/L (ref 39–117)
BILIRUBIN TOTAL: 0.6 mg/dL (ref 0.0–1.2)
BUN/Creatinine Ratio: 6 — ABNORMAL LOW (ref 10–22)
BUN: 7 mg/dL — AB (ref 8–27)
CO2: 28 mmol/L (ref 18–29)
Calcium: 9 mg/dL (ref 8.6–10.2)
Chloride: 95 mmol/L — ABNORMAL LOW (ref 97–108)
Creatinine, Ser: 1.15 mg/dL (ref 0.76–1.27)
GFR calc Af Amer: 69 mL/min/{1.73_m2} (ref >59)
GFR calc non Af Amer: 60 mL/min/{1.73_m2} (ref >59)
GLOBULIN, TOTAL: 2.8 g/dL (ref 1.5–4.5)
GLUCOSE: 92 mg/dL (ref 65–99)
Potassium: 4.2 mmol/L (ref 3.5–5.2)
SODIUM: 134 mmol/L (ref 134–144)
Total Protein: 6.8 g/dL (ref 6.0–8.5)

## 2014-09-01 LAB — PSA, TOTAL AND FREE
PSA, Free Pct: 27.5 %
PSA, Free: 0.22 ng/mL
PSA: 0.8 ng/mL (ref 0.0–4.0)

## 2014-09-03 ENCOUNTER — Telehealth: Payer: Self-pay | Admitting: Nurse Practitioner

## 2014-09-10 ENCOUNTER — Encounter: Payer: Self-pay | Admitting: Nurse Practitioner

## 2014-10-02 ENCOUNTER — Ambulatory Visit (INDEPENDENT_AMBULATORY_CARE_PROVIDER_SITE_OTHER): Payer: Medicare Other | Admitting: *Deleted

## 2014-10-02 ENCOUNTER — Encounter (INDEPENDENT_AMBULATORY_CARE_PROVIDER_SITE_OTHER): Payer: Self-pay

## 2014-10-02 ENCOUNTER — Encounter: Payer: Self-pay | Admitting: *Deleted

## 2014-10-02 VITALS — BP 135/69 | HR 74 | Ht 66.0 in | Wt 154.0 lb

## 2014-10-02 DIAGNOSIS — Z0000 Encounter for general adult medical examination without abnormal findings: Secondary | ICD-10-CM

## 2014-10-02 DIAGNOSIS — Z23 Encounter for immunization: Secondary | ICD-10-CM | POA: Diagnosis not present

## 2014-10-02 DIAGNOSIS — Z1211 Encounter for screening for malignant neoplasm of colon: Secondary | ICD-10-CM

## 2014-10-02 NOTE — Patient Instructions (Signed)
Information on Advanced Directives - [email or web address]  As we talked about, I am going to put in a referral for you to have a colonoscopy done by [facility] at [facility] in [place].  In the meantime - please complete the stool cards I gave you and return them to our office  Today you received a pneumonia booster vaccine    Preventive Care for Adults A healthy lifestyle and preventive care can promote health and wellness. Preventive health guidelines for men include the following key practices:  A routine yearly physical is a good way to check with your health care provider about your health and preventative screening. It is a chance to share any concerns and updates on your health and to receive a thorough exam.  Visit your dentist for a routine exam and preventative care every 6 months. Brush your teeth twice a day and floss once a day. Good oral hygiene prevents tooth decay and gum disease.  The frequency of eye exams is based on your age, health, family medical history, use of contact lenses, and other factors. Follow your health care provider's recommendations for frequency of eye exams.  Eat a healthy diet. Foods such as vegetables, fruits, whole grains, low-fat dairy products, and lean protein foods contain the nutrients you need without too many calories. Decrease your intake of foods high in solid fats, added sugars, and salt. Eat the right amount of calories for you.Get information about a proper diet from your health care provider, if necessary.  Regular physical exercise is one of the most important things you can do for your health. Most adults should get at least 150 minutes of moderate-intensity exercise (any activity that increases your heart rate and causes you to sweat) each week. In addition, most adults need muscle-strengthening exercises on 2 or more days a week.  Maintain a healthy weight. The body mass index (BMI) is a screening  tool to identify possible weight problems. It provides an estimate of body fat based on height and weight. Your health care provider can find your BMI and can help you achieve or maintain a healthy weight.For adults 20 years and older:  A BMI below 18.5 is considered underweight.  A BMI of 18.5 to 24.9 is normal.  A BMI of 25 to 29.9 is considered overweight.  A BMI of 30 and above is considered obese.  Maintain normal blood lipids and cholesterol levels by exercising and minimizing your intake of saturated fat. Eat a balanced diet with plenty of fruit and vegetables. Blood tests for lipids and cholesterol should begin at age 65 and be repeated every 5 years. If your lipid or cholesterol levels are high, you are over 50, or you are at high risk for heart disease, you may need your cholesterol levels checked more frequently.Ongoing high lipid and cholesterol levels should be treated with medicines if diet and exercise are not working.  If you smoke, find out from your health care provider how to quit. If you do not use tobacco, do not start.  Lung cancer screening is recommended for adults aged 90-80 years who are at high risk for developing lung cancer because of a history of smoking. A yearly low-dose CT scan of the lungs is recommended for people who have at least a 30-pack-year history of smoking and are a current smoker or have quit within the past 15 years. A pack year of smoking is smoking an average of 1 pack of cigarettes a day for 1  year (for example: 1 pack a day for 30 years or 2 packs a day for 15 years). Yearly screening should continue until the smoker has stopped smoking for at least 15 years. Yearly screening should be stopped for people who develop a health problem that would prevent them from having lung cancer treatment.  If you choose to drink alcohol, do not have more than 2 drinks per day. One drink is considered to be 12 ounces (355 mL) of beer, 5 ounces (148 mL) of wine, or  1.5 ounces (44 mL) of liquor.  Avoid use of street drugs. Do not share needles with anyone. Ask for help if you need support or instructions about stopping the use of drugs.  High blood pressure causes heart disease and increases the risk of stroke. Your blood pressure should be checked at least every 1-2 years. Ongoing high blood pressure should be treated with medicines, if weight loss and exercise are not effective.  If you are 28-6 years old, ask your health care provider if you should take aspirin to prevent heart disease.  Diabetes screening involves taking a blood sample to check your fasting blood sugar level. This should be done once every 3 years, after age 65, if you are within normal weight and without risk factors for diabetes. Testing should be considered at a younger age or be carried out more frequently if you are overweight and have at least 1 risk factor for diabetes.  Colorectal cancer can be detected and often prevented. Most routine colorectal cancer screening begins at the age of 73 and continues through age 93. However, your health care provider may recommend screening at an earlier age if you have risk factors for colon cancer. On a yearly basis, your health care provider may provide home test kits to check for hidden blood in the stool. Use of a small camera at the end of a tube to directly examine the colon (sigmoidoscopy or colonoscopy) can detect the earliest forms of colorectal cancer. Talk to your health care provider about this at age 14, when routine screening begins. Direct exam of the colon should be repeated every 5-10 years through age 55, unless early forms of precancerous polyps or small growths are found.  People who are at an increased risk for hepatitis B should be screened for this virus. You are considered at high risk for hepatitis B if:  You were born in a country where hepatitis B occurs often. Talk with your health care provider about which countries are  considered high risk.  Your parents were born in a high-risk country and you have not received a shot to protect against hepatitis B (hepatitis B vaccine).  You have HIV or AIDS.  You use needles to inject street drugs.  You live with, or have sex with, someone who has hepatitis B.  You are a man who has sex with other men (MSM).  You get hemodialysis treatment.  You take certain medicines for conditions such as cancer, organ transplantation, and autoimmune conditions.  Hepatitis C blood testing is recommended for all people born from [birth year] through 1965 and any individual with known risks for hepatitis C.  Practice safe sex. Use condoms and avoid high-risk sexual practices to reduce the spread of sexually transmitted infections (STIs). STIs include gonorrhea, chlamydia, syphilis, trichomonas, herpes, HPV, and human immunodeficiency virus (HIV). Herpes, HIV, and HPV are viral illnesses that have no cure. They can result in disability, cancer, and death.  If you are at  risk of being infected with HIV, it is recommended that you take a prescription medicine daily to prevent HIV infection. This is called preexposure prophylaxis (PrEP). You are considered at risk if:  You are a man who has sex with other men (MSM) and have other risk factors.  You are a heterosexual man, are sexually active, and are at increased risk for HIV infection.  You take drugs by injection.  You are sexually active with a partner who has HIV.  Talk with your health care provider about whether you are at high risk of being infected with HIV. If you choose to begin PrEP, you should first be tested for HIV. You should then be tested every 3 months for as long as you are taking PrEP.  A one-time screening for abdominal aortic aneurysm (AAA) and surgical repair of large AAAs by ultrasound are recommended for men ages 3 to 89 years who are current or former smokers.  Healthy men should no longer receive  prostate-specific antigen (PSA) blood tests as part of routine cancer screening. Talk with your health care provider about prostate cancer screening.  Testicular cancer screening is not recommended for adult males who have no symptoms. Screening includes self-exam, a health care provider exam, and other screening tests. Consult with your health care provider about any symptoms you have or any concerns you have about testicular cancer.  Use sunscreen. Apply sunscreen liberally and repeatedly throughout the day. You should seek shade when your shadow is shorter than you. Protect yourself by wearing long sleeves, pants, a wide-brimmed hat, and sunglasses year round, whenever you are outdoors.  Once a month, do a whole-body skin exam, using a mirror to look at the skin on your back. Tell your health care provider about new moles, moles that have irregular borders, moles that are larger than a pencil eraser, or moles that have changed in shape or color.  Stay current with required vaccines (immunizations).  Influenza vaccine. All adults should be immunized every year.  Tetanus, diphtheria, and acellular pertussis (Td, Tdap) vaccine. An adult who has not previously received Tdap or who does not know his vaccine status should receive 1 dose of Tdap. This initial dose should be followed by tetanus and diphtheria toxoids (Td) booster doses every 10 years. Adults with an unknown or incomplete history of completing a 3-dose immunization series with Td-containing vaccines should begin or complete a primary immunization series including a Tdap dose. Adults should receive a Td booster every 10 years.  Varicella vaccine. An adult without evidence of immunity to varicella should receive 2 doses or a second dose if he has previously received 1 dose.  Human papillomavirus (HPV) vaccine. Males aged 32-21 years who have not received the vaccine previously should receive the 3-dose series. Males aged 22-26 years may be  immunized. Immunization is recommended through the age of 66 years for any male who has sex with males and did not get any or all doses earlier. Immunization is recommended for any person with an immunocompromised condition through the age of 29 years if he did not get any or all doses earlier. During the 3-dose series, the second dose should be obtained 4-8 weeks after the first dose. The third dose should be obtained 24 weeks after the first dose and 16 weeks after the second dose.  Zoster vaccine. One dose is recommended for adults aged 49 years or older unless certain conditions are present.  Measles, mumps, and rubella (MMR) vaccine. Adults born before  1955 generally are considered immune to measles and mumps. Adults born in [birth year] or later should have 1 or more doses of MMR vaccine unless there is a contraindication to the vaccine or there is laboratory evidence of immunity to each of the three diseases. A routine second dose of MMR vaccine should be obtained at least 28 days after the first dose for students attending postsecondary schools, health care workers, or international travelers. People who received inactivated measles vaccine or an unknown type of measles vaccine during 1963-1967 should receive 2 doses of MMR vaccine. People who received inactivated mumps vaccine or an unknown type of mumps vaccine before 1979 and are at high risk for mumps infection should consider immunization with 2 doses of MMR vaccine. Unvaccinated health care workers born before [birth year] who lack laboratory evidence of measles, mumps, or rubella immunity or laboratory confirmation of disease should consider measles and mumps immunization with 2 doses of MMR vaccine or rubella immunization with 1 dose of MMR vaccine.  Pneumococcal 13-valent conjugate (PCV13) vaccine. When indicated, a person who is uncertain of his immunization history and has no record of immunization should receive the PCV13 vaccine. An adult aged 55 years or  older who has certain medical conditions and has not been previously immunized should receive 1 dose of PCV13 vaccine. This PCV13 should be followed with a dose of pneumococcal polysaccharide (PPSV23) vaccine. The PPSV23 vaccine dose should be obtained at least 8 weeks after the dose of PCV13 vaccine. An adult aged 24 years or older who has certain medical conditions and previously received 1 or more doses of PPSV23 vaccine should receive 1 dose of PCV13. The PCV13 vaccine dose should be obtained 1 or more years after the last PPSV23 vaccine dose.  Pneumococcal polysaccharide (PPSV23) vaccine. When PCV13 is also indicated, PCV13 should be obtained first. All adults aged 40 years and older should be immunized. An adult younger than age 69 years who has certain medical conditions should be immunized. Any person who resides in a nursing home or long-term care facility should be immunized. An adult smoker should be immunized. People with an immunocompromised condition and certain other conditions should receive both PCV13 and PPSV23 vaccines. People with human immunodeficiency virus (HIV) infection should be immunized as soon as possible after diagnosis. Immunization during chemotherapy or radiation therapy should be avoided. Routine use of PPSV23 vaccine is not recommended for American Indians, [place] Natives, or people younger than 65 years unless there are medical conditions that require PPSV23 vaccine. When indicated, people who have unknown immunization and have no record of immunization should receive PPSV23 vaccine. One-time revaccination 5 years after the first dose of PPSV23 is recommended for people aged 19-64 years who have chronic kidney failure, nephrotic syndrome, asplenia, or immunocompromised conditions. People who received 1-2 doses of PPSV23 before age 71 years should receive another dose of PPSV23 vaccine at age 94 years or later if at least 5 years have passed since the previous dose. Doses of  PPSV23 are not needed for people immunized with PPSV23 at or after age 103 years.  Meningococcal vaccine. Adults with asplenia or persistent complement component deficiencies should receive 2 doses of quadrivalent meningococcal conjugate (MenACWY-D) vaccine. The doses should be obtained at least 2 months apart. Microbiologists working with certain meningococcal bacteria, [employer] recruits, people at risk during an outbreak, and people who travel to or live in countries with a high rate of meningitis should be immunized. A first-year college student up through age 33 years  who is living in a residence hall should receive a dose if he did not receive a dose on or after his 16th birthday. Adults who have certain high-risk conditions should receive one or more doses of vaccine.  Hepatitis A vaccine. Adults who wish to be protected from this disease, have certain high-risk conditions, work with hepatitis A-infected animals, work in hepatitis A research labs, or travel to or work in countries with a high rate of hepatitis A should be immunized. Adults who were previously unvaccinated and who anticipate close contact with an international adoptee during the first 60 days after arrival in the [place] from a country with a high rate of hepatitis A should be immunized.  Hepatitis B vaccine. Adults should be immunized if they wish to be protected from this disease, have certain high-risk conditions, may be exposed to blood or other infectious body fluids, are household contacts or sex partners of hepatitis B positive people, are clients or workers in certain care facilities, or travel to or work in countries with a high rate of hepatitis B.  Haemophilus influenzae type b (Hib) vaccine. A previously unvaccinated person with asplenia or sickle cell disease or having a scheduled splenectomy should receive 1 dose of Hib vaccine. Regardless of previous immunization, a recipient of a hematopoietic stem cell transplant  should receive a 3-dose series 6-12 months after his successful transplant. Hib vaccine is not recommended for adults with HIV infection. Preventive Service / Frequency Ages 42 to 19  Blood pressure check.** / Every 1 to 2 years.  Lipid and cholesterol check.** / Every 5 years beginning at age 67.  Hepatitis C blood test.** / For any individual with known risks for hepatitis C.  Skin self-exam. / Monthly.  Influenza vaccine. / Every year.  Tetanus, diphtheria, and acellular pertussis (Tdap, Td) vaccine.** / Consult your health care provider. 1 dose of Td every 10 years.  Varicella vaccine.** / Consult your health care provider.  HPV vaccine. / 3 doses over 6 months, if 7 or younger.  Measles, mumps, rubella (MMR) vaccine.** / You need at least 1 dose of MMR if you were born in 1957 or later. You may also need a second dose.  Pneumococcal 13-valent conjugate (PCV13) vaccine.** / Consult your health care provider.  Pneumococcal polysaccharide (PPSV23) vaccine.** / 1 to 2 doses if you smoke cigarettes or if you have certain conditions.  Meningococcal vaccine.** / 1 dose if you are age 5 to 83 years and a Market researcher living in a residence hall, or have one of several medical conditions. You may also need additional booster doses.  Hepatitis A vaccine.** / Consult your health care provider.  Hepatitis B vaccine.** / Consult your health care provider.  Haemophilus influenzae type b (Hib) vaccine.** / Consult your health care provider. Ages 64 to 76  Blood pressure check.** / Every 1 to 2 years.  Lipid and cholesterol check.** / Every 5 years beginning at age 52.  Lung cancer screening. / Every year if you are aged 38-80 years and have a 30-pack-year history of smoking and currently smoke or have quit within the past 15 years. Yearly screening is stopped once you have quit smoking for at least 15 years or develop a health problem that would prevent you from having  lung cancer treatment.  Fecal occult blood test (FOBT) of stool. / Every year beginning at age 46 and continuing until age 11. You may not have to do this test if you get  a colonoscopy every 10 years.  Flexible sigmoidoscopy** or colonoscopy.** / Every 5 years for a flexible sigmoidoscopy or every 10 years for a colonoscopy beginning at age 64 and continuing until age 18.  Hepatitis C blood test.** / For all people born from [birth year] through 1963 and any individual with known risks for hepatitis C.  Skin self-exam. / Monthly.  Influenza vaccine. / Every year.  Tetanus, diphtheria, and acellular pertussis (Tdap/Td) vaccine.** / Consult your health care provider. 1 dose of Td every 10 years.  Varicella vaccine.** / Consult your health care provider.  Zoster vaccine.** / 1 dose for adults aged 17 years or older.  Measles, mumps, rubella (MMR) vaccine.** / You need at least 1 dose of MMR if you were born in 1955 or later. You may also need a second dose.  Pneumococcal 13-valent conjugate (PCV13) vaccine.** / Consult your health care provider.  Pneumococcal polysaccharide (PPSV23) vaccine.** / 1 to 2 doses if you smoke cigarettes or if you have certain conditions.  Meningococcal vaccine.** / Consult your health care provider.  Hepatitis A vaccine.** / Consult your health care provider.  Hepatitis B vaccine.** / Consult your health care provider.  Haemophilus influenzae type b (Hib) vaccine.** / Consult your health care provider. Ages 70 and over  Blood pressure check.** / Every 1 to 2 years.  Lipid and cholesterol check.**/ Every 5 years beginning at age 63.  Lung cancer screening. / Every year if you are aged 75-80 years and have a 30-pack-year history of smoking and currently smoke or have quit within the past 15 years. Yearly screening is stopped once you have quit smoking for at least 15 years or develop a health problem that would prevent you from having lung cancer  treatment.  Fecal occult blood test (FOBT) of stool. / Every year beginning at age 96 and continuing until age 40. You may not have to do this test if you get a colonoscopy every 10 years.  Flexible sigmoidoscopy** or colonoscopy.** / Every 5 years for a flexible sigmoidoscopy or every 10 years for a colonoscopy beginning at age 81 and continuing until age 65.  Hepatitis C blood test.** / For all people born from [birth year] through 1963 and any individual with known risks for hepatitis C.  Abdominal aortic aneurysm (AAA) screening.** / A one-time screening for ages 39 to 33 years who are current or former smokers.  Skin self-exam. / Monthly.  Influenza vaccine. / Every year.  Tetanus, diphtheria, and acellular pertussis (Tdap/Td) vaccine.** / 1 dose of Td every 10 years.  Varicella vaccine.** / Consult your health care provider.  Zoster vaccine.** / 1 dose for adults aged 59 years or older.  Pneumococcal 13-valent conjugate (PCV13) vaccine.** / Consult your health care provider.  Pneumococcal polysaccharide (PPSV23) vaccine.** / 1 dose for all adults aged 60 years and older.  Meningococcal vaccine.** / Consult your health care provider.  Hepatitis A vaccine.** / Consult your health care provider.  Hepatitis B vaccine.** / Consult your health care provider.  Haemophilus influenzae type b (Hib) vaccine.** / Consult your health care provider. **Family history and personal history of risk and conditions may change your health care provider's recommendations. Document Released: 07/28/2001 Document Revised: 06/06/2013 Document Reviewed: 10/27/2010 [facility] Patient Information 2015 [place], [place]. This information is not intended to replace advice given to you by your health care provider. Make sure you discuss any questions you have with your health care provider.

## 2014-10-02 NOTE — Progress Notes (Signed)
Subjective:   Alex [name] is a 81 y.o. male who presents for an Initial Medicare Annual Wellness Visit.  He is retired from [employer] in 1998.  He lives with his wife.  He has 3 children.  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  He has a history of lung cancer in 2007- he had a right upper lobectomy with seed implants, reports no problems since then.    Review of Systems   Cardiac Risk Factors include: advanced age (>66mn, >>26women);male gender;hypertension    Objective:    Today's Vitals   10/02/14 0924  BP: 135/69  Pulse: 74  Height: '5\' 6"'$  (1.676 [name])  Weight: 154 lb (69.854 kg)  PainSc: 0-No pain    Current Medications (verified) Outpatient Encounter Prescriptions as of 10/02/2014  Medication Sig  . amLODipine (NORVASC) 5 MG tablet TAKE ONE (1) TABLET EACH DAY  . aspirin 81 MG tablet Take 81 mg by mouth daily.    . cetirizine (ZYRTEC) 10 MG tablet Take 1 tablet (10 mg total) by mouth daily.  .Marland Kitchenesomeprazole (NEXIUM) 40 MG capsule Take 1 capsule (40 mg total) by mouth daily.  . fluticasone (FLONASE) 50 MCG/ACT nasal spray USE 2 SPRAYS IN EACH NOSTRIL DAILY  . memantine (NAMENDA) 10 MG tablet Take 1 tablet (10 mg total) by mouth 2 (two) times daily.  . metoprolol (LOPRESSOR) 50 MG tablet Take 1 tablet (50 mg total) by mouth 2 (two) times daily.  .Marland KitchenPROAIR HFA 108 (90 BASE) MCG/ACT inhaler USE 2 PUFFS 4 TIMES DAILY AS NEEDED  . simvastatin (ZOCOR) 20 MG tablet Take 1 tablet (20 mg total) by mouth at bedtime.  . tadalafil (CIALIS) 5 MG tablet Take 1 tablet (5 mg total) by mouth daily as needed for erectile dysfunction.    Allergies (verified) Review of patient's allergies indicates no known allergies.   History: Past Medical History  Diagnosis Date  . Hypertension   . TIA (transient ischemic attack)   . Lung cancer     rt upper lobe lobectomy 2007 radiation seed implants  . Inflammatory polyps of colon  with rectal bleeding   . COPD, moderate   . Hiatal hernia   . GERD (gastroesophageal reflux disease)   . Esophageal stricture   . Duodenitis   . Alcohol abuse   . Dementia   . Sinusitis   . Strep pharyngitis   . Post herpetic neuralgia   . Asthma   . Herpes zoster    Past Surgical History  Procedure Laterality Date  . Laparoscopic cholecystectomy  2003   Family History  Problem Relation Age of Onset  . Cancer Mother     Breast Cancer  . Heart disease Mother 831   MI  . Aneurysm Father   . Heart disease Father   . Alcohol abuse Brother   . Heart disease Brother    Social History   Occupational History  . Not on file.   Social History Main Topics  . Smoking status: Former Smoker    Types: Cigarettes    Quit date: 01/16/2002  . Smokeless tobacco: Never Used  . Alcohol Use: No  . Drug Use: No  . Sexual Activity: Yes   Tobacco Counseling Counseling given: Not Answered   Activities of Daily Living In your present state of health, do you have any difficulty performing the following activities: 10/02/2014 05/31/2014  Hearing? N N  Vision? N N  Difficulty concentrating or making decisions? N N  Walking or climbing stairs? N N  Dressing or bathing? N N  Doing errands, shopping? N N  Preparing Food and eating ? N -  Using the Toilet? N -  In the past six months, have you accidently leaked urine? N -  Do you have problems with loss of bowel control? N -  Managing your Medications? N -  Managing your Finances? N -  Housekeeping or managing your Housekeeping? N -    Immunizations and Health Maintenance Immunization History  Administered Date(s) Administered  . Influenza,inj,Quad PF,36+ Mos 03/15/2013, 03/20/2014  . Tdap 01/05/2011  . Zoster 08/08/2008   There are no preventive care reminders to display for this patient.  Patient Care Team: Alex Pretty, FNP as PCP - General (Nurse Practitioner)  Indicate any recent Medical Services you may have  received from other than [facility] providers in the past year (date may be approximate).    Assessment:    Hearing/Vision screen Patient sees the eye doctor at [facility] in [place].  Has history of bilateral cataracts, which he has had surgery for.  Reports that he sees well with his glasses No hearing deficits noted during visit   Dietary issues and exercise activities discussed: Current Exercise Habits:: The patient does not participate in regular exercise at present- he enjoys riding a bicycle for exercise, but has not been able to get out much due to the weather.  Plans to start back riding as the weather gets warmer.  Continue with health diet- mostly vegetables, fruit, and lean meats  Goals    None    Increase physical activity as the weather improves   Depression Screen PHQ 2/9 Scores 10/02/2014 08/31/2014 05/31/2014  PHQ - 2 Score 0 0 0    Fall Risk Fall Risk  10/02/2014 08/31/2014 05/31/2014  Falls in the past year? No No No    Cognitive Function:   Screening Tests Health Maintenance  Topic Date Due  . ZOSTAVAX  11/30/2014 (Originally 07/23/1994)  . TETANUS/TDAP  11/30/2014 (Originally 07/23/1953)  . PNA vac Low Risk Adult (1 of 2 - PCV13) 12/01/2014 (Originally 07/24/1999)  . COLONOSCOPY  06/01/2015 (Originally 07/23/1984)  . INFLUENZA VACCINE  01/14/2015        Plan:   Patient agreeable to have colonoscopy done at [facility] in [place] by [facility].  Referral entered for colonoscopy.  Stool cards given - asked patient to do stool cards and return them back to the office while awaiting colonoscopy appointment.  During the course of the visit Echo was educated and counseled about the following appropriate screening and preventive services:   Vaccines to include Pneumoccal, Influenza, Td, Zostavax- up to date - Prevnar 13 given today  Colorectal cancer screening- referral made  Diabetes screening- up to date  Nutrition counseling- discussed  today  Prostate cancer screening- up to date  Smoking cessation counseling- Quit in 2003 - encouraged to remain quit  Patient Instructions (the written plan) were given to the patient.   Alex Emory M, RN   10/02/2014       I have reviewed and agree with the above AWV documentation.  Claretta Fraise, [name].D.

## 2014-10-04 ENCOUNTER — Other Ambulatory Visit: Payer: Medicare Other

## 2014-10-04 DIAGNOSIS — Z1212 Encounter for screening for malignant neoplasm of rectum: Secondary | ICD-10-CM

## 2014-10-04 NOTE — Progress Notes (Signed)
Lab only 

## 2014-10-06 LAB — FECAL OCCULT BLOOD, IMMUNOCHEMICAL: FECAL OCCULT BLD: NEGATIVE

## 2014-10-08 ENCOUNTER — Encounter: Payer: Self-pay | Admitting: *Deleted

## 2014-10-10 ENCOUNTER — Telehealth: Payer: Self-pay

## 2014-10-10 NOTE — Telephone Encounter (Signed)
Pt was referred by Chevis Pretty for a screening colonoscopy.  Pt's wife said he had last one in [place] but he is not sure where over 10 years ago. He is not having any problems at this time. OV scheduled with Walden Field , NP on 10/24/2014 at 2:00 PM to discuss.

## 2014-10-10 NOTE — Telephone Encounter (Signed)
Pt's wife called to say they received a letter from [facility] to set Mr Amory up for a colonoscopy. Please call [phone]

## 2014-10-24 ENCOUNTER — Ambulatory Visit (INDEPENDENT_AMBULATORY_CARE_PROVIDER_SITE_OTHER): Payer: Medicare Other | Admitting: Nurse Practitioner

## 2014-10-24 ENCOUNTER — Other Ambulatory Visit: Payer: Self-pay

## 2014-10-24 ENCOUNTER — Encounter: Payer: Self-pay | Admitting: Nurse Practitioner

## 2014-10-24 VITALS — BP 123/67 | HR 58 | Temp 97.8°F | Ht 67.0 in | Wt 155.4 lb

## 2014-10-24 DIAGNOSIS — Z860100 Personal history of colon polyps, unspecified: Secondary | ICD-10-CM

## 2014-10-24 MED ORDER — NA SULFATE-K SULFATE-MG SULF 17.5-3.13-1.6 GM/177ML PO SOLN: 1.0000 | Freq: Once | ORAL | Status: DC

## 2014-10-24 NOTE — Patient Instructions (Signed)
1. We will schedule your procedure (colonoscopy) for you. 2. Further recommendations to be based on results your procedure.

## 2014-10-24 NOTE — Assessment & Plan Note (Addendum)
Patient referred by PCP for screening colonoscopy. Last colonoscopy approximately 2003 in [place], patient thinks it was done at [facility]. Records not available on the chart. We'll request records from [place]. Patient states multiple polyps were taken off though "visit they were all fine." At this point is been 13 years since his last colonoscopy and although he is 81 years old he is a very young and vibrant acting 81 years old. Agree with recommendation for screening colonoscopy and is very vibrant 81 year old male with a history of colon polyps. Patient states he also desires to proceed.  Proceed with colonoscopy with Dr. Oneida Alar in the near future. The risks, benefits, and alternatives have been discussed in detail with the patient. They state understanding and desire to proceed.   Patient is not on any anticoagulants other than 81 mg daily aspirin. Patient's past medical history states "alcohol abuse" although the patient denies this. Says he did use to drink some but his last alcoholic drink was approximately 2003 when he quit drinking and smoking at the same time. He is not on any long-term pain medications, anti-anxiety medications, antidepressants.

## 2014-10-24 NOTE — Progress Notes (Signed)
Primary Care Physician:  Chevis Pretty, FNP Primary Gastroenterologist:  Dr. Oneida Alar  Chief Complaint  Patient presents with  . set up TCS    HPI:   81 year old male presents on referral from PCP for screening colonoscopy. Last colonoscopy appears to be in 2003 per patient report although no procedure notes are available to indicate this. Today he states he did have a colonoscopy in approximately 2003 in [place], possibly [facility]. He said this was a combination EGD/Colonoscopy and had esophageal dilation at the same time. He thinks they wound polyps and "it was not any cancer, they said everything was all right." Denies abdominal pain, hematochezia, melena. Did have a stool hemoccult card which was negative on 10/04/14. Denies chest pain, dizziness, syncope, near syncope. Does have dyspnea due to history of COPD, but is no worse then baseline. Denies any other upper or lower GI symptoms.  Past Medical History  Diagnosis Date  . Hypertension   . TIA (transient ischemic attack)   . Lung cancer     rt upper lobe lobectomy 2007 radiation seed implants  . Inflammatory polyps of colon with rectal bleeding   . COPD, moderate   . Hiatal hernia   . GERD (gastroesophageal reflux disease)   . Esophageal stricture   . Duodenitis   . Alcohol abuse   . Dementia   . Sinusitis   . Strep pharyngitis   . Post herpetic neuralgia   . Asthma   . Herpes zoster     Past Surgical History  Procedure Laterality Date  . Laparoscopic cholecystectomy  2003    Current Outpatient Prescriptions  Medication Sig Dispense Refill  . amLODipine (NORVASC) 5 MG tablet TAKE ONE (1) TABLET EACH DAY 30 tablet 5  . aspirin 81 MG tablet Take 81 mg by mouth daily.      . cetirizine (ZYRTEC) 10 MG tablet Take 1 tablet (10 mg total) by mouth daily. 30 tablet 11  . esomeprazole (NEXIUM) 40 MG capsule Take 1 capsule (40 mg total) by mouth daily. 30 capsule 5  . fluticasone (FLONASE) 50 MCG/ACT  nasal spray USE 2 SPRAYS IN EACH NOSTRIL DAILY 16 g 5  . memantine (NAMENDA) 10 MG tablet Take 1 tablet (10 mg total) by mouth 2 (two) times daily. 30 tablet 5  . metoprolol (LOPRESSOR) 50 MG tablet Take 1 tablet (50 mg total) by mouth 2 (two) times daily. 60 tablet 5  . PROAIR HFA 108 (90 BASE) MCG/ACT inhaler USE 2 PUFFS 4 TIMES DAILY AS NEEDED 8.5 g 5  . simvastatin (ZOCOR) 20 MG tablet Take 1 tablet (20 mg total) by mouth at bedtime. 30 tablet 5  . tadalafil (CIALIS) 5 MG tablet Take 1 tablet (5 mg total) by mouth daily as needed for erectile dysfunction. (Patient not taking: Reported on 10/24/2014) 10 tablet 11   No current facility-administered medications for this visit.    Allergies as of 10/24/2014  . (No Known Allergies)    Family History  Problem Relation Age of Onset  . Cancer Mother     Breast Cancer  . Heart disease Mother 53    MI  . Aneurysm Father   . Heart disease Father   . Alcohol abuse Brother   . Heart disease Brother     History   Social History  . Marital Status: Married    Spouse Name: N/A  . Number of Children: N/A  . Years of Education: N/A   Occupational History  .  Not on file.   Social History Main Topics  . Smoking status: Former Smoker    Types: Cigarettes    Quit date: 01/16/2002  . Smokeless tobacco: Never Used  . Alcohol Use: No  . Drug Use: No  . Sexual Activity: Yes   Other Topics Concern  . Not on file   Social History Narrative    Review of Systems: General: Negative for anorexia, weight loss, fever, chills, fatigue, weakness. Eyes: Negative for vision changes.  ENT: Negative for hoarseness, difficulty swallowing , nasal congestion. CV: Negative for chest pain, angina, palpitations, dyspnea on exertion, peripheral edema.  Respiratory: Negative for dyspnea at rest, dyspnea on exertion, cough, sputum, wheezing.  GI: See history of present illness. GU:  Negative for dysuria, hematuria, urinary incontinence, urinary  frequency, nocturnal urination.  MS: Negative for joint pain, low back pain.  Derm: Negative for rash or itching.  Neuro: Negative for weakness, abnormal sensation, seizure, frequent headaches, memory loss, confusion.  Psych: Negative for anxiety, depression, suicidal ideation, hallucinations.  Endo: Negative for unusual weight change.  Heme: Negative for bruising or bleeding. Allergy: Negative for rash or hives.    Physical Exam: BP 123/67 mmHg  Pulse 58  Temp(Src) 97.8 F (36.6 C)  Ht '5\' 7"'$  (1.702 m)  Wt 155 lb 6.4 oz (70.489 kg)  BMI 24.33 kg/m2 General:   Alert and oriented. Pleasant and cooperative. Well-nourished and well-developed.  Head:  Normocephalic and atraumatic. Eyes:  Without icterus, sclera clear and conjunctiva pink.  Ears:  Normal auditory acuity. Nose:  No deformity, discharge,  or lesions. Mouth:  No deformity or lesions, oral mucosa pink.  Neck:  Supple, without mass or thyromegaly. Lungs:  Clear to auscultation bilaterally. No wheezes, rales, or rhonchi. No distress.  Heart:  S1, S2 present without murmurs appreciated.  Abdomen:  +BS, soft, non-tender and non-distended. No HSM noted. No guarding or rebound. No masses appreciated.  Rectal:  Deferred  Msk:  Symmetrical without gross deformities. Normal posture. Pulses:  Normal pulses noted. Extremities:  Without clubbing or edema. Neurologic:  Alert and  oriented x4;  grossly normal neurologically. Skin:  Intact without significant lesions or rashes. Cervical Nodes:  No significant cervical adenopathy. Psych:  Alert and cooperative. Normal mood and affect.     10/24/2014 1:46 PM

## 2014-10-25 NOTE — Progress Notes (Signed)
cc'ed to pcp °

## 2014-11-02 ENCOUNTER — Other Ambulatory Visit: Payer: Self-pay | Admitting: Nurse Practitioner

## 2014-11-20 ENCOUNTER — Ambulatory Visit (HOSPITAL_COMMUNITY)
Admission: RE | Admit: 2014-11-20 | Discharge: 2014-11-20 | Disposition: A | Discharge status: Home / Self Care | Payer: Medicare Other | Source: Ambulatory Visit | Attending: Gastroenterology | Admitting: Gastroenterology

## 2014-11-20 ENCOUNTER — Encounter (HOSPITAL_COMMUNITY)
Admission: RE | Disposition: A | Discharge status: Home / Self Care | Payer: Self-pay | Source: Ambulatory Visit | Attending: Gastroenterology

## 2014-11-20 ENCOUNTER — Encounter (HOSPITAL_COMMUNITY): Payer: Self-pay | Admitting: *Deleted

## 2014-11-20 DIAGNOSIS — F0390 Unspecified dementia without behavioral disturbance: Secondary | ICD-10-CM | POA: Diagnosis not present

## 2014-11-20 DIAGNOSIS — Z87891 Personal history of nicotine dependence: Secondary | ICD-10-CM | POA: Diagnosis not present

## 2014-11-20 DIAGNOSIS — Z8673 Personal history of transient ischemic attack (TIA), and cerebral infarction without residual deficits: Secondary | ICD-10-CM | POA: Insufficient documentation

## 2014-11-20 DIAGNOSIS — K648 Other hemorrhoids: Secondary | ICD-10-CM | POA: Insufficient documentation

## 2014-11-20 DIAGNOSIS — J449 Chronic obstructive pulmonary disease, unspecified: Secondary | ICD-10-CM | POA: Diagnosis not present

## 2014-11-20 DIAGNOSIS — D123 Benign neoplasm of transverse colon: Secondary | ICD-10-CM | POA: Insufficient documentation

## 2014-11-20 DIAGNOSIS — Z79899 Other long term (current) drug therapy: Secondary | ICD-10-CM | POA: Insufficient documentation

## 2014-11-20 DIAGNOSIS — K219 Gastro-esophageal reflux disease without esophagitis: Secondary | ICD-10-CM | POA: Insufficient documentation

## 2014-11-20 DIAGNOSIS — Z1211 Encounter for screening for malignant neoplasm of colon: Secondary | ICD-10-CM | POA: Diagnosis not present

## 2014-11-20 DIAGNOSIS — J45909 Unspecified asthma, uncomplicated: Secondary | ICD-10-CM | POA: Insufficient documentation

## 2014-11-20 DIAGNOSIS — Z7982 Long term (current) use of aspirin: Secondary | ICD-10-CM | POA: Insufficient documentation

## 2014-11-20 DIAGNOSIS — D127 Benign neoplasm of rectosigmoid junction: Secondary | ICD-10-CM | POA: Diagnosis not present

## 2014-11-20 DIAGNOSIS — Z9049 Acquired absence of other specified parts of digestive tract: Secondary | ICD-10-CM | POA: Insufficient documentation

## 2014-11-20 DIAGNOSIS — Z86010 Personal history of colonic polyps: Secondary | ICD-10-CM

## 2014-11-20 DIAGNOSIS — I10 Essential (primary) hypertension: Secondary | ICD-10-CM | POA: Insufficient documentation

## 2014-11-20 DIAGNOSIS — K621 Rectal polyp: Secondary | ICD-10-CM

## 2014-11-20 DIAGNOSIS — Z85118 Personal history of other malignant neoplasm of bronchus and lung: Secondary | ICD-10-CM | POA: Diagnosis not present

## 2014-11-20 HISTORY — PX: COLONOSCOPY: SHX5424

## 2014-11-20 SURGERY — COLONOSCOPY
Anesthesia: Moderate Sedation

## 2014-11-20 MED ORDER — MEPERIDINE HCL 100 MG/ML IJ SOLN
INTRAMUSCULAR | Status: AC
  Filled 2014-11-20: qty 2

## 2014-11-20 MED ORDER — MIDAZOLAM HCL 5 MG/5ML IJ SOLN
INTRAMUSCULAR | Status: AC
  Filled 2014-11-20: qty 10

## 2014-11-20 MED ORDER — SODIUM CHLORIDE 0.9 % IV SOLN
INTRAVENOUS | Status: DC
  Administered 2014-11-20: 13:00:00 via INTRAVENOUS

## 2014-11-20 MED ORDER — STERILE WATER FOR IRRIGATION IR SOLN
Status: DC | PRN
  Administered 2014-11-20: 14:00:00

## 2014-11-20 MED ORDER — MIDAZOLAM HCL 5 MG/5ML IJ SOLN
INTRAMUSCULAR | Status: DC | PRN
  Administered 2014-11-20: 1 mg via INTRAVENOUS
  Administered 2014-11-20: 2 mg via INTRAVENOUS
  Administered 2014-11-20: 1 mg via INTRAVENOUS

## 2014-11-20 MED ORDER — MEPERIDINE HCL 100 MG/ML IJ SOLN
INTRAMUSCULAR | Status: DC | PRN
Start: 2014-11-20 — End: 2014-11-20
  Administered 2014-11-20 (×2): 25 mg via INTRAVENOUS

## 2014-11-20 NOTE — Discharge Instructions (Signed)
You had 4 polyps removed. You have internal hemorrhoids.   FOLLOW A HIGH FIBER DIET. AVOID ITEMS THAT CAUSE BLOATING & GAS. SEE INFO BELOW.  YOUR BIOPSY RESULTS WILL BE AVAILABLE IN MY CHART AFTER JUN 9 AND MY OFFICE WILL CONTACT YOU IN 10-14 DAYS WITH YOUR RESULTS.   Next colonoscopy in 10-15 YEARS IF THE BENEFITS OUTWEIGH THE RISKS.   Colonoscopy Care After Read the instructions outlined below and refer to this sheet in the next week. These discharge instructions provide you with general information on caring for yourself after you leave the hospital. While your treatment has been planned according to the most current medical practices available, unavoidable complications occasionally occur. If you have any problems or questions after discharge, call DR. Bohdan Macho, [phone].  ACTIVITY  You may resume your regular activity, but move at a slower pace for the next 24 hours.   Take frequent rest periods for the next 24 hours.   Walking will help get rid of the air and reduce the bloated feeling in your belly (abdomen).   No driving for 24 hours (because of the medicine (anesthesia) used during the test).   You may shower.   Do not sign any important legal documents or operate any machinery for 24 hours (because of the anesthesia used during the test).    NUTRITION  Drink plenty of fluids.   You may resume your normal diet as instructed by your doctor.   Begin with a light meal and progress to your normal diet. Heavy or fried foods are harder to digest and may make you feel sick to your stomach (nauseated).   Avoid alcoholic beverages for 24 hours or as instructed.    MEDICATIONS  You may resume your normal medications.   WHAT YOU CAN EXPECT TODAY  Some feelings of bloating in the abdomen.   Passage of more gas than usual.   Spotting of blood in your stool or on the toilet paper  .  IF YOU HAD POLYPS REMOVED DURING THE COLONOSCOPY:  Eat a soft diet IF YOU HAVE  NAUSEA, BLOATING, ABDOMINAL PAIN, OR VOMITING.    FINDING OUT THE RESULTS OF YOUR TEST Not all test results are available during your visit. DR. Oneida Alar WILL CALL YOU WITHIN 14 DAYS OF YOUR PROCEDUE WITH YOUR RESULTS. Do not assume everything is normal if you have not heard from DR. Marylynn Rigdon CALL HER OFFICE AT [phone].  SEEK IMMEDIATE MEDICAL ATTENTION AND CALL THE OFFICE: [phone] IF:  You have more than a spotting of blood in your stool.   Your belly is swollen (abdominal distention).   You are nauseated or vomiting.   You have a temperature over 101F.   You have abdominal pain or discomfort that is severe or gets worse throughout the day.   High-Fiber Diet A high-fiber diet changes your normal diet to include more whole grains, legumes, fruits, and vegetables. Changes in the diet involve replacing refined carbohydrates with unrefined foods. The calorie level of the diet is essentially unchanged. The Dietary Reference Intake (recommended amount) for adult males is 38 grams per day. For adult females, it is 25 grams per day. Pregnant and lactating women should consume 28 grams of fiber per day. Fiber is the intact part of a plant that is not broken down during digestion. Functional fiber is fiber that has been isolated from the plant to provide a beneficial effect in the body. PURPOSE  Increase stool bulk.   Ease and regulate bowel movements.  Lower cholesterol.   REDUCE RISK OF COLON CANCER  INDICATIONS THAT YOU NEED MORE FIBER  Constipation and hemorrhoids.   Uncomplicated diverticulosis (intestine condition) and irritable bowel syndrome.   Weight management.   As a protective measure against hardening of the arteries (atherosclerosis), diabetes, and cancer.   GUIDELINES FOR INCREASING FIBER IN THE DIET  Start adding fiber to the diet slowly. A gradual increase of about 5 more grams (2 slices of whole-wheat bread, 2 servings of most fruits or vegetables, or 1  bowl of high-fiber cereal) per day is best. Too rapid an increase in fiber may result in constipation, flatulence, and bloating.   Drink enough water and fluids to keep your urine clear or pale yellow. Water, juice, or caffeine-free drinks are recommended. Not drinking enough fluid may cause constipation.   Eat a variety of high-fiber foods rather than one type of fiber.   Try to increase your intake of fiber through using high-fiber foods rather than fiber pills or supplements that contain small amounts of fiber.   The goal is to change the types of food eaten. Do not supplement your present diet with high-fiber foods, but replace foods in your present diet.   INCLUDE A VARIETY OF FIBER SOURCES  Replace refined and processed grains with whole grains, canned fruits with fresh fruits, and incorporate other fiber sources. White rice, white breads, and most bakery goods contain little or no fiber.   Brown whole-grain rice, buckwheat oats, and many fruits and vegetables are all good sources of fiber. These include: broccoli, Brussels sprouts, cabbage, cauliflower, beets, sweet potatoes, white potatoes (skin on), carrots, tomatoes, eggplant, squash, berries, fresh fruits, and dried fruits.   Cereals appear to be the richest source of fiber. Cereal fiber is found in whole grains and bran. Bran is the fiber-rich outer coat of cereal grain, which is largely removed in refining. In whole-grain cereals, the bran remains. In breakfast cereals, the largest amount of fiber is found in those with "bran" in their names. The fiber content is sometimes indicated on the label.   You may need to include additional fruits and vegetables each day.   In baking, for 1 cup white flour, you may use the following substitutions:   1 cup whole-wheat flour minus 2 tablespoons.   1/2 cup white flour plus 1/2 cup whole-wheat flour.   Polyps, Colon  A polyp is extra tissue that grows inside your body. Colon polyps grow  in the large intestine. The large intestine, also called the colon, is part of your digestive system. It is a long, hollow tube at the end of your digestive tract where your body makes and stores stool. Most polyps are not dangerous. They are benign. This means they are not cancerous. But over time, some types of polyps can turn into cancer. Polyps that are smaller than a pea are usually not harmful. But larger polyps could someday become or may already be cancerous. To be safe, doctors remove all polyps and test them.   PREVENTION There is not one sure way to prevent polyps. You might be able to lower your risk of getting them if you:  Eat more fruits and vegetables and less fatty food.   Do not smoke.   Avoid alcohol.   Exercise every day.   Lose weight if you are overweight.   Eating more calcium and folate can also lower your risk of getting polyps. Some foods that are rich in calcium are milk, cheese, and broccoli.  Some foods that are rich in folate are chickpeas, kidney beans, and spinach.   Hemorrhoids Hemorrhoids are dilated (enlarged) veins around the rectum. Sometimes clots will form in the veins. This makes them swollen and painful. These are called thrombosed hemorrhoids. Causes of hemorrhoids include:  Constipation.   Straining to have a bowel movement.   HEAVY LIFTING  HOME CARE INSTRUCTIONS  Eat a well balanced diet and drink 6 to 8 glasses of water every day to avoid constipation. You may also use a bulk laxative.   Avoid straining to have bowel movements.   Keep anal area dry and clean.   Do not use a donut shaped pillow or sit on the toilet for long periods. This increases blood pooling and pain.   Move your bowels when your body has the urge; this will require less straining and will decrease pain and pressure.

## 2014-11-20 NOTE — Progress Notes (Signed)
REVIEWED-NO ADDITIONAL RECOMMENDATIONS. 

## 2014-11-20 NOTE — Op Note (Signed)
[facility] [address] [place], [address]   COLONOSCOPY PROCEDURE REPORT  PATIENT: Alex, [name]  MR#: [id number] BIRTHDATE: [birth year] , 82  yrs. old GENDER: male ENDOSCOPIST: Danie Binder, MD REFERRED JT:TSVX M Martin, N.P. PROCEDURE DATE:  2014-11-24 PROCEDURE:   Colonoscopy with snare polypectomy and Colonoscopy with cold biopsy polypectomy INDICATIONS:high risk patient with personal history of colonic polyps. MEDICATIONS: Demerol 25 mg IV and Versed 4 mg IV  DESCRIPTION OF PROCEDURE:    Physical exam was performed.  Informed consent was obtained from the patient after explaining the benefits, risks, and alternatives to procedure.  The patient was connected to monitor and placed in left lateral position. Continuous oxygen was provided by nasal cannula and IV medicine administered through an indwelling cannula.  After administration of sedation and rectal exam, the patients rectum was intubated and the EC-[id number] ([id number])  colonoscope was advanced under direct visualization to the cecum.  The scope was removed slowly by carefully examining the color, texture, anatomy, and integrity mucosa on the way out.  The patient was recovered in endoscopy and discharged home in satisfactory condition.    COLON FINDINGS: Two sessile polyps ranging from 5 to 19m in size were found in the transverse colon.  A polypectomy was performed using snare cautery.  , Two sessile polyps ranging from 2 to 4107min size were found in the rectum.  A polypectomy was performed with cold forceps.  , and Small internal hemorrhoids were found.  PREP QUALITY: excellent. CECAL W/D TIME: 13       minutes  COMPLICATIONS: None  ENDOSCOPIC IMPRESSION: 1.   FOUR COLORECTAL POLYPS REMOVED 2.   Small internal hemorrhoids  RECOMMENDATIONS: AWAIT BIOPSY HIGH FIBER DIET TCS IN 10-15 YEARS IF THE BENEFITS OUTWEIGH THE RISKS   eSigned:  SaDanie BinderMD 0611-Jun-2016:59 PM    CPT  CODES: ICD CODES:  The ICD and CPT codes recommended by this software are interpretations from the data that the clinical staff has captured with the software.  The verification of the translation of this report to the ICD and CPT codes and modifiers is the sole responsibility of the health care institution and practicing physician where this report was generated.  [facility] not be held responsible for the validity of the ICD and CPT codes included on this report.  AMA assumes no liability for data contained or not contained herein. CPT is a reDesigner, television/film setf the [employer]

## 2014-11-20 NOTE — H&P (Signed)
Primary Care Physician:  Chevis Pretty, FNP Primary Gastroenterologist:  Dr. Oneida Alar  Pre-Procedure History & Physical: HPI:  Alex [name] is a 81 y.o. male here for [place].  Past Medical History  Diagnosis Date  . Hypertension   . TIA (transient ischemic attack)   . Lung cancer     rt upper lobe lobectomy 2007 radiation seed implants  . Inflammatory polyps of colon with rectal bleeding   . COPD, moderate   . Hiatal hernia   . GERD (gastroesophageal reflux disease)   . Esophageal stricture   . Duodenitis   . Alcohol abuse     last drink 2003  . Dementia   . Sinusitis   . Strep pharyngitis   . Post herpetic neuralgia   . Asthma   . Herpes zoster     Past Surgical History  Procedure Laterality Date  . Laparoscopic cholecystectomy  2003  . Lung removal, partial Right     right upper lung lobectomy - radiation seeds implanted    Prior to Admission medications   Medication Sig Start Date End Date Taking? Authorizing Provider  amLODipine (NORVASC) 5 MG tablet TAKE ONE (1) TABLET EACH DAY 05/31/14  Yes Mary-Margaret Hassell Done, FNP  aspirin 81 MG tablet Take 81 mg by mouth daily.     Yes Historical Provider, MD  cetirizine (ZYRTEC) 10 MG tablet Take 1 tablet (10 mg total) by mouth daily. 10/05/13  Yes Mary-Margaret Hassell Done, FNP  esomeprazole (NEXIUM) 40 MG capsule Take 1 capsule (40 mg total) by mouth daily. 05/31/14  Yes Mary-Margaret Hassell Done, FNP  fluticasone [facility]) 50 MCG/ACT nasal spray USE 2 SPRAYS IN [facility] NOSTRIL DAILY 08/31/14  Yes Chipper Herb, MD  memantine (NAMENDA) 10 MG tablet TAKE ONE TABLET BY MOUTH TWICE DAILY 11/02/14  Yes Mary-Margaret Hassell Done, FNP  metoprolol (LOPRESSOR) 50 MG tablet TAKE ONE TABLET BY MOUTH TWICE DAILY 11/02/14  Yes Mary-Margaret Hassell Done, FNP  Na Sulfate-K Sulfate-Mg Sulf SOLN Take 1 Container by mouth once. 10/24/14 11/23/14 Yes Carlis Stable, NP  PROAIR HFA 108 (90 BASE) MCG/ACT inhaler USE 2 PUFFS 4 TIMES DAILY AS NEEDED  08/31/14  Yes Chipper Herb, MD  simvastatin (ZOCOR) 20 MG tablet Take 1 tablet (20 mg total) by mouth at bedtime. 05/31/14  Yes Mary-Margaret Hassell Done, FNP  tadalafil (CIALIS) 5 MG tablet Take 1 tablet (5 mg total) by mouth daily as needed for erectile dysfunction. Patient not taking: Reported on 11/09/2014 05/31/14   Mary-Margaret Hassell Done, FNP    Allergies as of 10/24/2014  . (No Known Allergies)    Family History  Problem Relation Age of Onset  . Cancer Mother     Breast Cancer  . Heart disease Mother 27    MI  . Aneurysm Father   . Heart disease Father   . Alcohol abuse Brother   . Heart disease Brother   . Colon cancer Neg Hx     History   Social History  . Marital Status: Married    Spouse Name: N/A  . Number of Children: N/A  . Years of Education: N/A   Occupational History  . Not on file.   Social History Main Topics  . Smoking status: Former Smoker    Types: Cigarettes    Quit date: 01/16/2002  . Smokeless tobacco: Never Used  . Alcohol Use: No     Comment: Last alcohol use 2003  . Drug Use: No  . Sexual Activity: Yes   Other Topics Concern  .  Not on file   Social History Narrative    Review of Systems: See HPI, otherwise negative ROS   Physical Exam: BP 136/72 mmHg  Pulse 71  Temp(Src) 97.6 F (36.4 C) (Oral)  Resp 17  Ht '5\' 7"'$  (1.702 m)  Wt 155 lb (70.308 kg)  BMI 24.27 kg/m2  SpO2 95% General:   Alert,  pleasant and cooperative in NAD Head:  Normocephalic and atraumatic. Neck:  Supple; Lungs:  Clear throughout to auscultation.    Heart:  Regular rate and rhythm. Abdomen:  Soft, nontender and nondistended. Normal bowel sounds, without guarding, and without rebound.   Neurologic:  Alert and  oriented x4;  grossly normal neurologically.  Impression/Plan:     SCREENING  Plan:  1. TCS TODAY

## 2014-11-21 ENCOUNTER — Encounter (HOSPITAL_COMMUNITY): Payer: Self-pay | Admitting: Gastroenterology

## 2014-11-21 ENCOUNTER — Telehealth: Payer: Self-pay

## 2014-11-21 NOTE — Telephone Encounter (Signed)
I called to check on pt since Dr. Oneida Alar has been at the hospital doing procedures all day.  Pt is no longer having chills and is feeling fine.

## 2014-11-21 NOTE — Telephone Encounter (Signed)
Pt's wife, Vaughan Basta called and said the pt had a colonoscopy yesterday and had some polyps removed. She said he did great yesterday. Today he is having chills. Otherwise feels fine.  No complaints of any pain or anything. She cannot take his temperature, thermometer does not work. Please advise!

## 2014-11-21 NOTE — Telephone Encounter (Signed)
REVIEWED. AGREE. NO ADDITIONAL RECOMMENDATIONS. 

## 2014-11-29 ENCOUNTER — Other Ambulatory Visit: Payer: Self-pay | Admitting: Nurse Practitioner

## 2014-12-03 ENCOUNTER — Telehealth: Payer: Self-pay | Admitting: Gastroenterology

## 2014-12-03 NOTE — Telephone Encounter (Signed)
Please call pt. HE had TWO simple adenomas AND TWO HYPERP[LASTIC POLYPS removed. FOLLOW A HIGH FIBER DIET. TCS IN 10-15 YEARS IF THE BENEFITS OUTWEIGH THE RISKS.

## 2014-12-03 NOTE — Telephone Encounter (Signed)
REMINDER IN EPIC °

## 2014-12-03 NOTE — Telephone Encounter (Signed)
Pt is aware.  

## 2015-02-15 ENCOUNTER — Ambulatory Visit (INDEPENDENT_AMBULATORY_CARE_PROVIDER_SITE_OTHER): Payer: Medicare Other | Admitting: Nurse Practitioner

## 2015-02-15 ENCOUNTER — Encounter: Payer: Self-pay | Admitting: Nurse Practitioner

## 2015-02-15 VITALS — BP 146/70 | HR 64 | Temp 97.1°F | Ht 67.0 in | Wt 154.0 lb

## 2015-02-15 DIAGNOSIS — K219 Gastro-esophageal reflux disease without esophagitis: Secondary | ICD-10-CM

## 2015-02-15 DIAGNOSIS — I10 Essential (primary) hypertension: Secondary | ICD-10-CM | POA: Diagnosis not present

## 2015-02-15 DIAGNOSIS — E785 Hyperlipidemia, unspecified: Secondary | ICD-10-CM | POA: Diagnosis not present

## 2015-02-15 DIAGNOSIS — F0390 Unspecified dementia without behavioral disturbance: Secondary | ICD-10-CM | POA: Diagnosis not present

## 2015-02-15 DIAGNOSIS — N400 Enlarged prostate without lower urinary tract symptoms: Secondary | ICD-10-CM | POA: Diagnosis not present

## 2015-02-15 DIAGNOSIS — J302 Other seasonal allergic rhinitis: Secondary | ICD-10-CM | POA: Diagnosis not present

## 2015-02-15 MED ORDER — METOPROLOL TARTRATE 50 MG PO TABS: 50.0000 mg | ORAL_TABLET | Freq: Two times a day (BID) | ORAL | Status: DC

## 2015-02-15 MED ORDER — FLUTICASONE PROPIONATE 50 MCG/ACT NA SUSP: 2.0000 | Freq: Every day | NASAL | Status: DC

## 2015-02-15 MED ORDER — CETIRIZINE HCL 10 MG PO TABS: 10.0000 mg | ORAL_TABLET | Freq: Every day | ORAL | Status: DC

## 2015-02-15 MED ORDER — MEMANTINE HCL 10 MG PO TABS: 10.0000 mg | ORAL_TABLET | Freq: Two times a day (BID) | ORAL | Status: DC

## 2015-02-15 MED ORDER — AMLODIPINE BESYLATE 5 MG PO TABS: ORAL_TABLET | ORAL | Status: DC

## 2015-02-15 MED ORDER — ESOMEPRAZOLE MAGNESIUM 40 MG PO CPDR: 40.0000 mg | DELAYED_RELEASE_CAPSULE | Freq: Every day | ORAL | Status: DC

## 2015-02-15 MED ORDER — SIMVASTATIN 20 MG PO TABS: 20.0000 mg | ORAL_TABLET | Freq: Every day | ORAL | Status: DC

## 2015-02-15 NOTE — Progress Notes (Signed)
Subjective:    Patient ID: Alex [name], male    DOB: August 24, 1932, 81 y.o.   MRN: [id number]  Patient here today for follow up of chronic medical problems. No complaints today  Hypertension This is a chronic problem. The current episode started more than 1 year ago. The problem is controlled. Pertinent negatives include no chest pain, headaches, neck pain, palpitations or shortness of breath. Risk factors for coronary artery disease include dyslipidemia and male gender. Past treatments include beta blockers and calcium channel blockers. The current treatment provides moderate improvement. Compliance problems include diet and exercise.   Hyperlipidemia This is a chronic problem. The current episode started more than 1 year ago. The problem is uncontrolled. Recent lipid tests were reviewed and are variable. Pertinent negatives include no chest pain or shortness of breath. Current antihyperlipidemic treatment includes statins. The current treatment provides moderate improvement of lipids. Compliance problems include adherence to diet and adherence to exercise.  Risk factors for coronary artery disease include dyslipidemia, hypertension and male sex.  GERD nexium keeps symptoms under control Dementia namenda daily- seems to be helping with his memory- he says it seems to really help him- no side effects Allergic rhinitis Zyrtec and flonase as needed- helps a lot with allergies- only uses when they ar flared up. BPH cialis daily- helps with urinary flow.    Review of Systems  Constitutional: Negative.   HENT: Negative.   Respiratory: Negative for shortness of breath.   Cardiovascular: Negative for chest pain and palpitations.  Genitourinary: Negative.   Musculoskeletal: Negative for neck pain.  Neurological: Negative for headaches.  Psychiatric/Behavioral: Negative.   All other systems reviewed and are negative.      Objective:   Physical Exam  Constitutional: He is oriented to  person, place, and time. He appears well-developed and well-nourished.  HENT:  Head: Normocephalic.  Right Ear: External ear normal.  Left Ear: External ear normal.  Nose: Nose normal.  Mouth/Throat: Oropharynx is clear and moist.  Eyes: EOM are normal. Pupils are equal, round, and reactive to light.  Neck: Normal range of motion. Neck supple. No JVD present. No thyromegaly present.  Cardiovascular: Normal rate, regular rhythm, normal heart sounds and intact distal pulses.  Exam reveals no gallop and no friction rub.   No murmur heard. Pulmonary/Chest: Effort normal and breath sounds normal. No respiratory distress. He has no wheezes. He has no rales. He exhibits no tenderness.  Abdominal: Soft. Bowel sounds are normal. He exhibits no mass. There is no tenderness.  Genitourinary: Prostate normal and penis normal.  Musculoskeletal: Normal range of motion. He exhibits no edema.  Lymphadenopathy:    He has no cervical adenopathy.  Neurological: He is alert and oriented to person, place, and time. No cranial nerve deficit.  Skin: Skin is warm and dry.  Psychiatric: He has a normal mood and affect. His behavior is normal. Judgment and thought content normal.    BP 146/70 mmHg  Pulse 64  Temp(Src) 97.1 F (36.2 C) (Oral)  Ht _0  (1.702 m)  Wt 154 lb (69.854 kg)  BMI 24.11 kg/m2     Assessment & Plan:   1. Essential hypertension Do not add salt to diet - amLODipine (NORVASC) 5 MG tablet; TAKE ONE (1) TABLET EACH DAY  Dispense: 30 tablet; Refill: 5 - metoprolol (LOPRESSOR) 50 MG tablet; Take 1 tablet (50 mg total) by mouth 2 (two) times daily.  Dispense: 60 tablet; Refill: 5 - CMP14+EGFR  2. Gastroesophageal reflux disease  without esophagitis Avoid spicy foods Do not eat 2 hours prior to bedtime - esomeprazole (NEXIUM) 40 MG capsule; Take 1 capsule (40 mg total) by mouth daily.  Dispense: 30 capsule; Refill: 5  3. Dementia, without behavioral disturbance Word puzzles and games  to stimulate brain and memory - memantine (NAMENDA) 10 MG tablet; Take 1 tablet (10 mg total) by mouth 2 (two) times daily.  Dispense: 60 tablet; Refill: 5  4. BPH (benign prostatic hyperplasia)  5. Hyperlipidemia with target LDL less than 100 Low fat diet - simvastatin (ZOCOR) 20 MG tablet; Take 1 tablet (20 mg total) by mouth at bedtime.  Dispense: 30 tablet; Refill: 5 - Lipid panel  6. Seasonal allergic rhinitis  - cetirizine (ZYRTEC) 10 MG tablet; Take 1 tablet (10 mg total) by mouth daily.  Dispense: 30 tablet; Refill: 11 - fluticasone (FLONASE) 50 MCG/ACT nasal spray; Place 2 sprays into both nostrils daily.  Dispense: 16 g; Refill: 5    Labs pending Health maintenance reviewed Diet and exercise encouraged Continue all meds Follow up  In 3 month   Callaghan, FNP

## 2015-02-15 NOTE — Patient Instructions (Signed)

## 2015-02-16 LAB — CMP14+EGFR
A/G RATIO: 1.3 (ref 1.1–2.5)
ALT: 12 IU/L (ref 0–44)
AST: 19 IU/L (ref 0–40)
Albumin: 3.9 g/dL (ref 3.5–4.7)
Alkaline Phosphatase: 80 IU/L (ref 39–117)
BILIRUBIN TOTAL: 0.3 mg/dL (ref 0.0–1.2)
BUN/Creatinine Ratio: 10 (ref 10–22)
BUN: 10 mg/dL (ref 8–27)
CHLORIDE: 96 mmol/L — AB (ref 97–108)
CO2: 27 mmol/L (ref 18–29)
Calcium: 9.1 mg/dL (ref 8.6–10.2)
Creatinine, Ser: 1.05 mg/dL (ref 0.76–1.27)
GFR calc Af Amer: 77 mL/min/{1.73_m2} (ref >59)
GFR calc non Af Amer: 67 mL/min/{1.73_m2} (ref >59)
GLUCOSE: 93 mg/dL (ref 65–99)
Globulin, Total: 2.9 g/dL (ref 1.5–4.5)
POTASSIUM: 4.5 mmol/L (ref 3.5–5.2)
Sodium: 136 mmol/L (ref 134–144)
Total Protein: 6.8 g/dL (ref 6.0–8.5)

## 2015-02-16 LAB — LIPID PANEL
CHOLESTEROL TOTAL: 123 mg/dL (ref 100–199)
Chol/HDL Ratio: 2.6 ratio units (ref 0.0–5.0)
HDL: 48 mg/dL (ref >39)
LDL Calculated: 53 mg/dL (ref 0–99)
TRIGLYCERIDES: 110 mg/dL (ref 0–149)
VLDL CHOLESTEROL CAL: 22 mg/dL (ref 5–40)

## 2015-02-19 ENCOUNTER — Encounter: Payer: Self-pay | Admitting: Physician Assistant

## 2015-02-19 ENCOUNTER — Ambulatory Visit (INDEPENDENT_AMBULATORY_CARE_PROVIDER_SITE_OTHER): Payer: Medicare Other | Admitting: Physician Assistant

## 2015-02-19 VITALS — BP 138/69 | HR 67 | Temp 97.0°F | Ht 67.0 in | Wt 153.0 lb

## 2015-02-19 DIAGNOSIS — L821 Other seborrheic keratosis: Secondary | ICD-10-CM | POA: Diagnosis not present

## 2015-02-19 DIAGNOSIS — L820 Inflamed seborrheic keratosis: Secondary | ICD-10-CM

## 2015-02-19 NOTE — Progress Notes (Signed)
   Subjective:    Patient ID: Alex [name], male    DOB: Jul 07, 1932, 81 y.o.   MRN: [id number]  [place] y/o male presents for enlarging lesion on scalp that is irritated when he wears a cap or combs his hair. He also has a dark lesion on his right lateral eye and right neck.     Review of Systems  Unable to perform ROS Constitutional: Negative.   HENT: Negative.   Eyes: Negative.   Respiratory: Negative.   Cardiovascular: Negative.   Gastrointestinal: Negative.   All other systems reviewed and are negative.      Objective:   Physical Exam  Skin:  Hyperkeratotic irritated, inflamed  verrucal lesion on left anterior scalp, 1.5cm in diameter.   wartlike pigmented lesion on right neck and right lateral eye. Benign in appearance.           Assessment & Plan:  1. Inflamed seborrheic keratosis - Treated with cryosurgery x 1. Appears benign. No further tx required  2. Seborrheic keratosis - Benign. No treatment required.    Dailon Sheeran A. Benjamin Stain PA-C

## 2015-03-27 ENCOUNTER — Ambulatory Visit (INDEPENDENT_AMBULATORY_CARE_PROVIDER_SITE_OTHER): Payer: Medicare Other

## 2015-03-27 DIAGNOSIS — Z23 Encounter for immunization: Secondary | ICD-10-CM

## 2015-04-30 ENCOUNTER — Ambulatory Visit (INDEPENDENT_AMBULATORY_CARE_PROVIDER_SITE_OTHER): Payer: Medicare Other | Admitting: Family Medicine

## 2015-04-30 ENCOUNTER — Encounter: Payer: Self-pay | Admitting: Family Medicine

## 2015-04-30 VITALS — BP 121/59 | HR 61 | Temp 97.1°F | Ht 67.0 in | Wt 157.0 lb

## 2015-04-30 DIAGNOSIS — J329 Chronic sinusitis, unspecified: Secondary | ICD-10-CM | POA: Diagnosis not present

## 2015-04-30 DIAGNOSIS — J301 Allergic rhinitis due to pollen: Secondary | ICD-10-CM | POA: Diagnosis not present

## 2015-04-30 MED ORDER — AMOXICILLIN 500 MG PO CAPS: 500.0000 mg | ORAL_CAPSULE | Freq: Three times a day (TID) | ORAL | Status: DC

## 2015-04-30 NOTE — Progress Notes (Signed)
Subjective:    Patient ID: Alex [name], male    DOB: 01/18/1933, 81 y.o.   MRN: [id number]  HPI Patient here tonight for possible sinus infection. He states that it started about 1 week ago and has gotten worse. The patient has nasal congestion and sinus pressure and cough in addition to headaches. The patient denies any fever but says he has had some chills. He is also has some headaches. This is gotten worse recently.      Patient Active Problem List   Diagnosis Date Noted  . History of colonic polyps 10/24/2014  . BPH (benign prostatic hyperplasia) 08/31/2014  . Allergic rhinitis 10/05/2013  . Hyperlipidemia with target LDL less than 100 07/27/2013  . Hypertension 07/27/2013  . GERD (gastroesophageal reflux disease) 07/27/2013  . Dementia 07/27/2013   Outpatient Encounter Prescriptions as of 04/30/2015  Medication Sig  . amLODipine (NORVASC) 5 MG tablet TAKE ONE (1) TABLET EACH DAY  . aspirin 81 MG tablet Take 81 mg by mouth daily.    . cetirizine (ZYRTEC) 10 MG tablet Take 1 tablet (10 mg total) by mouth daily.  Marland Kitchen esomeprazole (NEXIUM) 40 MG capsule Take 1 capsule (40 mg total) by mouth daily.  . fluticasone (FLONASE) 50 MCG/ACT nasal spray Place 2 sprays into both nostrils daily.  . memantine (NAMENDA) 10 MG tablet Take 1 tablet (10 mg total) by mouth 2 (two) times daily.  . metoprolol (LOPRESSOR) 50 MG tablet Take 1 tablet (50 mg total) by mouth 2 (two) times daily.  Marland Kitchen PROAIR HFA 108 (90 BASE) MCG/ACT inhaler USE 2 PUFFS 4 TIMES DAILY AS NEEDED  . simvastatin (ZOCOR) 20 MG tablet Take 1 tablet (20 mg total) by mouth at bedtime.   No facility-administered encounter medications on file as of 04/30/2015.      Review of Systems  Constitutional: Negative.  Negative for fever.  HENT: Positive for congestion (nasal), postnasal drip and sinus pressure.   Eyes: Negative.   Respiratory: Positive for cough.   Cardiovascular: Negative.   Gastrointestinal: Negative.     Endocrine: Negative.   Genitourinary: Negative.   Musculoskeletal: Negative.   Skin: Negative.   Allergic/Immunologic: Negative.   Neurological: Positive for headaches.  Hematological: Negative.   Psychiatric/Behavioral: Negative.        Objective:   Physical Exam  Constitutional: He is oriented to person, place, and time. He appears well-developed and well-nourished. No distress.  HENT:  Head: Normocephalic and atraumatic.  Right Ear: External ear normal.  Left Ear: External ear normal.  Mouth/Throat: Oropharynx is clear and moist. No oropharyngeal exudate.  Nasal congestion bilaterally and frontal maxillary and ethmoid sinus tenderness bilaterally  Eyes: Conjunctivae and EOM are normal. Pupils are equal, round, and reactive to light. Right eye exhibits no discharge. Left eye exhibits no discharge. No scleral icterus.  Neck: Normal range of motion. Neck supple. No thyromegaly present.  Anterior cervical tenderness  Cardiovascular: Normal rate, regular rhythm and normal heart sounds.   No murmur heard. Pulmonary/Chest: Effort normal and breath sounds normal. No respiratory distress. He has no wheezes. He has no rales. He exhibits no tenderness.  Lungs are clear anteriorly and posteriorly with a dry cough  Abdominal: Bowel sounds are normal. He exhibits no mass.  Musculoskeletal: Normal range of motion.  Lymphadenopathy:    He has no cervical adenopathy.  Neurological: He is alert and oriented to person, place, and time.  Skin: Skin is warm and dry. No rash noted.  Psychiatric: He has a  normal mood and affect. His behavior is normal. Judgment and thought content normal.  Nursing note and vitals reviewed.  BP 121/59 mmHg  Pulse 61  Temp(Src) 97.1 F (36.2 C) (Oral)  Ht '5\' 7"'$  (1.702 m)  Wt 157 lb (71.215 kg)  BMI 24.58 kg/m2        Assessment & Plan:  1. Rhinosinusitis -Use nasal saline during the day and Flonase at night -Use Mucinex as needed for cough and  congestion with a large glass of water - amoxicillin (AMOXIL) 500 MG capsule; Take 1 capsule (500 mg total) by mouth 3 (three) times daily.  Dispense: 30 capsule; Refill: 0  2. Allergic rhinitis due to pollen -Use nasal saline and Flonase as directed  Patient Instructions  Drink plenty of fluids and stay well hydrated Keep the house as cool as possible Use the nasal saline frequently in each nostril during the day The Flonase 1 spray each nostril at bedtime for allergic rhinitis Take Mucinex if needed for cough and congestion 1 twice daily with a large glass of water Take antibiotic as directed until completed   Arrie Senate MD

## 2015-04-30 NOTE — Patient Instructions (Signed)
Drink plenty of fluids and stay well hydrated Keep the house as cool as possible Use the nasal saline frequently in each nostril during the day The Flonase 1 spray each nostril at bedtime for allergic rhinitis Take Mucinex if needed for cough and congestion 1 twice daily with a large glass of water Take antibiotic as directed until completed

## 2015-08-29 ENCOUNTER — Other Ambulatory Visit: Payer: Self-pay | Admitting: Nurse Practitioner

## 2015-09-30 ENCOUNTER — Other Ambulatory Visit: Payer: Self-pay | Admitting: Nurse Practitioner

## 2015-10-24 ENCOUNTER — Encounter: Payer: Self-pay | Admitting: Family Medicine

## 2015-10-24 ENCOUNTER — Ambulatory Visit (INDEPENDENT_AMBULATORY_CARE_PROVIDER_SITE_OTHER): Payer: Medicare Other | Admitting: Family Medicine

## 2015-10-24 VITALS — BP 128/73 | HR 76 | Temp 98.8°F | Ht 67.0 in | Wt 157.8 lb

## 2015-10-24 DIAGNOSIS — E785 Hyperlipidemia, unspecified: Secondary | ICD-10-CM

## 2015-10-24 DIAGNOSIS — F0390 Unspecified dementia without behavioral disturbance: Secondary | ICD-10-CM | POA: Diagnosis not present

## 2015-10-24 DIAGNOSIS — I10 Essential (primary) hypertension: Secondary | ICD-10-CM

## 2015-10-24 DIAGNOSIS — J309 Allergic rhinitis, unspecified: Secondary | ICD-10-CM

## 2015-10-24 MED ORDER — PREDNISONE 20 MG PO TABS: ORAL_TABLET | ORAL | Status: DC

## 2015-10-24 NOTE — Progress Notes (Signed)
BP 128/73 mmHg  Pulse 76  Temp(Src) 98.8 F (37.1 C) (Oral)  Ht 5' 7"  (1.702 m)  Wt 157 lb 12.8 oz (71.578 kg)  BMI 24.71 kg/m2   Subjective:    Patient ID: Alex [name], male    DOB: 1932/11/22, 82 y.o.   MRN: [id number]  HPI: Alex [name] is a 82 y.o. male presenting on 10/24/2015 for Annual Exam   HPI Hypertension recheck Patient comes in for a six-month hypertension recheck today. His blood pressure today is 128/73. Patient is currently on Norvasc and metoprolol and denies any issues with those. Patient denies headaches, blurred vision, chest pains, shortness of breath, or weakness. Denies any side effects from medication and is content with current medication.   Hyperlipidemia Patient is currently on simvastatin 20 mg. He denies any issues with medication. He denies any myalgias or history of liver issues.  Dementia recheck Patient comes in for recheck on his dementia today. He feels like he is doing very well on the medication and is not having any further memory issues. He thinks his wife says the same. He is currently on Namenda. He denies any major side effects from it.  Sinus congestion and allergies. Patient has been having recurrent sinus congestion and allergies and pressure and postnasal drainage. He has been trying to use his Flonase and Zyrtec regularly for this and it helps some but is not helping completely eradicated. This time years especially bad for him when it comes to the pollen. He denies any fevers or chills or shortness of breath or wheezing. He denies any general ill feeling or malaise as well.  Relevant past medical, surgical, family and social history reviewed and updated as indicated. Interim medical history since our last visit reviewed. Allergies and medications reviewed and updated.  Review of Systems  Constitutional: Negative for fever and chills.  HENT: Positive for congestion, postnasal drip, rhinorrhea, sinus pressure, sneezing and sore  throat. Negative for ear discharge, ear pain and voice change.   Eyes: Negative for pain, discharge, redness and visual disturbance.  Respiratory: Negative for cough, chest tightness, shortness of breath and wheezing.   Cardiovascular: Negative for chest pain and leg swelling.  Gastrointestinal: Negative for abdominal pain, diarrhea and constipation.  Genitourinary: Negative for difficulty urinating.  Musculoskeletal: Negative for back pain and gait problem.  Skin: Negative for rash.  Neurological: Negative for dizziness, syncope, light-headedness and headaches.  All other systems reviewed and are negative.   Per HPI unless specifically indicated above     Medication List       This list is accurate as of: 10/24/15  2:13 PM.  Always use your most recent med list.               amLODipine 5 MG tablet  Commonly known as:  NORVASC  TAKE ONE (1) TABLET EACH DAY     aspirin 81 MG tablet  Take 81 mg by mouth daily.     CENTRUM SILVER ADULT 50+ PO  Take by mouth daily.     cetirizine 10 MG tablet  Commonly known as:  ZYRTEC  Take 1 tablet (10 mg total) by mouth daily.     esomeprazole 40 MG capsule  Commonly known as:  NEXIUM  TAKE ONE (1) CAPSULE EACH DAY     fluticasone 50 MCG/ACT nasal spray  Commonly known as:  FLONASE  Place 2 sprays into both nostrils daily.     memantine 10 MG tablet  Commonly known  as:  NAMENDA  TAKE ONE TABLET BY MOUTH TWICE DAILY     metoprolol 50 MG tablet  Commonly known as:  LOPRESSOR  TAKE ONE TABLET BY MOUTH TWICE DAILY     predniSONE 20 MG tablet  Commonly known as:  DELTASONE  2 po at same time daily for 5 days     PROAIR HFA 108 (90 Base) MCG/ACT inhaler  Generic drug:  albuterol  USE 2 PUFFS 4 TIMES DAILY AS NEEDED     simvastatin 20 MG tablet  Commonly known as:  ZOCOR  TAKE ONE TABLET DAILY AT BEDTIME           Objective:    BP 128/73 mmHg  Pulse 76  Temp(Src) 98.8 F (37.1 C) (Oral)  Ht 5' 7"  (1.702 m)  Wt  157 lb 12.8 oz (71.578 kg)  BMI 24.71 kg/m2  Wt Readings from Last 3 Encounters:  10/24/15 157 lb 12.8 oz (71.578 kg)  04/30/15 157 lb (71.215 kg)  02/19/15 153 lb (69.4 kg)    Physical Exam  Constitutional: He is oriented to person, place, and time. He appears well-developed and well-nourished. No distress.  HENT:  Right Ear: Tympanic membrane, external ear and ear canal normal.  Left Ear: Tympanic membrane, external ear and ear canal normal.  Nose: Mucosal edema and rhinorrhea present. No sinus tenderness. No epistaxis. Right sinus exhibits maxillary sinus tenderness. Right sinus exhibits no frontal sinus tenderness. Left sinus exhibits maxillary sinus tenderness. Left sinus exhibits no frontal sinus tenderness.  Mouth/Throat: Uvula is midline and mucous membranes are normal. Posterior oropharyngeal edema and posterior oropharyngeal erythema present. No oropharyngeal exudate or tonsillar abscesses.  Eyes: Conjunctivae and EOM are normal. Pupils are equal, round, and reactive to light. Right eye exhibits no discharge. No scleral icterus.  Neck: Neck supple. No thyromegaly present.  Cardiovascular: Normal rate, regular rhythm, normal heart sounds and intact distal pulses.   No murmur heard. Pulmonary/Chest: Effort normal and breath sounds normal. No respiratory distress. He has no wheezes. He has no rales.  Musculoskeletal: Normal range of motion. He exhibits no edema.  Lymphadenopathy:    He has no cervical adenopathy.  Neurological: He is alert and oriented to person, place, and time. Coordination normal.  Skin: Skin is warm and dry. No rash noted. He is not diaphoretic.  Psychiatric: He has a normal mood and affect. His behavior is normal.  Nursing note and vitals reviewed.   Results for orders placed or performed in visit on 02/15/15  CMP14+EGFR  Result Value Ref Range   Glucose 93 65 - 99 mg/dL   BUN 10 8 - 27 mg/dL   Creatinine, Ser 1.05 0.76 - 1.27 mg/dL   GFR calc non Af  Amer 67 >59 mL/min/1.73   GFR calc Af Amer 77 >59 mL/min/1.73   BUN/Creatinine Ratio 10 10 - 22   Sodium 136 134 - 144 mmol/L   Potassium 4.5 3.5 - 5.2 mmol/L   Chloride 96 (L) 97 - 108 mmol/L   CO2 27 18 - 29 mmol/L   Calcium 9.1 8.6 - 10.2 mg/dL   Total Protein 6.8 6.0 - 8.5 g/dL   Albumin 3.9 3.5 - 4.7 g/dL   Globulin, Total 2.9 1.5 - 4.5 g/dL   Albumin/Globulin Ratio 1.3 1.1 - 2.5   Bilirubin Total 0.3 0.0 - 1.2 mg/dL   Alkaline Phosphatase 80 39 - 117 IU/L   AST 19 0 - 40 IU/L   ALT 12 0 - 44 IU/L  Lipid  panel  Result Value Ref Range   Cholesterol, Total 123 100 - 199 mg/dL   Triglycerides 110 0 - 149 mg/dL   HDL 48 >39 mg/dL   VLDL Cholesterol Cal 22 5 - 40 mg/dL   LDL Calculated 53 0 - 99 mg/dL   Chol/HDL Ratio 2.6 0.0 - 5.0 ratio units      Assessment & Plan:   Problem List Items Addressed This Visit      Cardiovascular and Mediastinum   Hypertension - Primary   Relevant Orders   CMP14+EGFR     Respiratory   Allergic rhinitis   Relevant Medications   predniSONE (DELTASONE) 20 MG tablet     Nervous and Auditory   Dementia   Relevant Orders   TSH   Folate   Vitamin B12     Other   Hyperlipidemia with target LDL less than 100   Relevant Orders   Lipid panel      Follow up plan: Return in about 6 months (around 04/25/2016), or if symptoms worsen or fail to improve, for Hypertension recheck.  Counseling provided for all of the vaccine components Orders Placed This Encounter  Procedures  . CMP14+EGFR  . Lipid panel  . TSH  . Folate  . Vitamin B12    Caryl Pina, MD [facility] 10/24/2015, 2:13 PM

## 2015-10-25 LAB — CMP14+EGFR
A/G RATIO: 1.3 (ref 1.2–2.2)
ALK PHOS: 85 IU/L (ref 39–117)
ALT: 11 IU/L (ref 0–44)
AST: 21 IU/L (ref 0–40)
Albumin: 4 g/dL (ref 3.5–4.7)
BILIRUBIN TOTAL: 0.3 mg/dL (ref 0.0–1.2)
BUN/Creatinine Ratio: 9 — ABNORMAL LOW (ref 10–24)
BUN: 10 mg/dL (ref 8–27)
CHLORIDE: 95 mmol/L — AB (ref 96–106)
CO2: 27 mmol/L (ref 18–29)
Calcium: 9 mg/dL (ref 8.6–10.2)
Creatinine, Ser: 1.14 mg/dL (ref 0.76–1.27)
GFR calc non Af Amer: 60 mL/min/{1.73_m2} (ref >59)
GFR, EST AFRICAN AMERICAN: 69 mL/min/{1.73_m2} (ref >59)
GLUCOSE: 74 mg/dL (ref 65–99)
Globulin, Total: 3.1 g/dL (ref 1.5–4.5)
Potassium: 4.5 mmol/L (ref 3.5–5.2)
Sodium: 136 mmol/L (ref 134–144)
Total Protein: 7.1 g/dL (ref 6.0–8.5)

## 2015-10-25 LAB — LIPID PANEL
CHOLESTEROL TOTAL: 140 mg/dL (ref 100–199)
Chol/HDL Ratio: 3.4 ratio units (ref 0.0–5.0)
HDL: 41 mg/dL (ref >39)
LDL Calculated: 47 mg/dL (ref 0–99)
Triglycerides: 260 mg/dL — ABNORMAL HIGH (ref 0–149)
VLDL Cholesterol Cal: 52 mg/dL — ABNORMAL HIGH (ref 5–40)

## 2015-10-25 LAB — VITAMIN B12: VITAMIN B 12: 1003 pg/mL — AB (ref 211–946)

## 2015-10-25 LAB — FOLATE: Folate: >20 ng/mL (ref >3.0)

## 2015-10-25 LAB — TSH: TSH: 3.93 u[IU]/mL (ref 0.450–4.500)

## 2015-10-29 ENCOUNTER — Other Ambulatory Visit: Payer: Self-pay | Admitting: Nurse Practitioner

## 2015-10-29 ENCOUNTER — Other Ambulatory Visit: Payer: Self-pay | Admitting: Family Medicine

## 2015-12-29 ENCOUNTER — Other Ambulatory Visit: Payer: Self-pay | Admitting: Nurse Practitioner

## 2016-01-09 ENCOUNTER — Telehealth: Payer: Self-pay | Admitting: Pharmacist

## 2016-01-21 ENCOUNTER — Encounter: Payer: Self-pay | Admitting: Pharmacist

## 2016-01-21 ENCOUNTER — Ambulatory Visit (INDEPENDENT_AMBULATORY_CARE_PROVIDER_SITE_OTHER): Payer: Medicare Other | Admitting: Pharmacist

## 2016-01-21 VITALS — BP 122/72 | HR 72 | Ht 67.0 in | Wt 155.0 lb

## 2016-01-21 DIAGNOSIS — Z85118 Personal history of other malignant neoplasm of bronchus and lung: Secondary | ICD-10-CM

## 2016-01-21 DIAGNOSIS — Z0000 Encounter for general adult medical examination without abnormal findings: Secondary | ICD-10-CM

## 2016-01-21 DIAGNOSIS — H9193 Unspecified hearing loss, bilateral: Secondary | ICD-10-CM

## 2016-01-21 DIAGNOSIS — R0602 Shortness of breath: Secondary | ICD-10-CM

## 2016-01-21 NOTE — Patient Instructions (Addendum)
  [name] , Thank you for taking time to come for your Medicare Wellness Visit. I appreciate your ongoing commitment to your health goals. Please review the following plan we discussed and let me know if I can assist you in the future.   These are the goals we discussed:  Continue to stay active - riding bike and other activities  Sending referrals for check CTof chest to [facility] radiology and to audiologist (hearing specialist)  Increase non-starchy vegetables - carrots, green bean, squash, zucchini, tomatoes, onions, peppers, spinach and other green leafy vegetables, cabbage, lettuce, cucumbers, asparagus, okra (not fried), eggplant Limit sugar and processed foods (cakes, cookies, ice cream, crackers and chips) Increase fresh fruit but limit serving sizes 1/2 cup or about the size of tennis or baseball Limit red meat to no more than 1-2 times per week (serving size about the size of your palm) Choose whole grains / lean proteins - whole wheat bread, quinoa, whole grain rice (1/2 cup), fish, chicken, [place] Avoid sugar and calorie containing beverages - soda, sweet tea and juice.  Choose water or unsweetened tea instead.   This is a list of the screening recommended for you and due dates:  Health Maintenance  Topic Date Due  . Pneumonia vaccines (2 of 2 - PPSV23) Completed  . Flu Shot  01/14/2016  . Tetanus Vaccine  01/04/2021  . Shingles Vaccine  Completed

## 2016-01-21 NOTE — Progress Notes (Signed)
Patient ID: Alex [name], male   DOB: 1933-04-11, 82 y.o.   MRN: [id number]    Subjective:   Alex [name] is a 82 y.o. male who presents for a Subsequent Medicare Annual Wellness Visit.  He is retired from [employer] in 1998.  He lives in [place], [place] with his wife of 4 years.   He has 3 children.  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    Review of Systems  Review of Systems  Constitutional: Negative.   HENT: Negative.   Eyes: Negative.   Respiratory: Positive for shortness of breath. Sputum production: patient's wife reports that he is getting "more winded" lately.  Patient has h/o lung cancer and she is concerned.   Cardiovascular: Negative.   Gastrointestinal: Negative.   Genitourinary: Negative.   Musculoskeletal: Negative.   Skin: Negative.   Neurological: Negative.   Endo/Heme/Allergies: Negative.   Psychiatric/Behavioral: Negative.      Current Medications (verified) Outpatient Encounter Prescriptions as of 01/21/2016  Medication Sig  . amLODipine (NORVASC) 5 MG tablet TAKE ONE (1) TABLET EACH DAY  . aspirin 81 MG tablet Take 81 mg by mouth daily.    . cetirizine (ZYRTEC) 10 MG tablet TAKE ONE (1) TABLET EACH DAY  . esomeprazole (NEXIUM) 40 MG capsule TAKE ONE (1) CAPSULE EACH DAY  . fluticasone (FLONASE) 50 MCG/ACT nasal spray Place 2 sprays into both nostrils daily.  . memantine (NAMENDA) 10 MG tablet TAKE ONE TABLET BY MOUTH TWICE DAILY  . metoprolol (LOPRESSOR) 50 MG tablet TAKE ONE TABLET BY MOUTH TWICE DAILY  . Multiple Vitamins-Minerals (CENTRUM SILVER ADULT 50+ PO) Take by mouth daily.  Marland Kitchen PROAIR HFA 108 (90 Base) MCG/ACT inhaler USE 2 PUFFS 4 TIMES DAILY  . simvastatin (ZOCOR) 20 MG tablet TAKE ONE TABLET DAILY AT BEDTIME  . [DISCONTINUED] predniSONE (DELTASONE) 20 MG tablet 2 po at same time daily for 5 days (Patient not taking: Reported on 01/21/2016)   No facility-administered encounter  medications on file as of 01/21/2016.     Allergies (verified) Review of patient's allergies indicates no known allergies.   History: Past Medical History:  Diagnosis Date  . Alcohol abuse    last drink 2003  . Allergy   . Asthma   . COPD, moderate ([place])   . Dementia   . Dementia   . Duodenitis   . Esophageal stricture   . GERD (gastroesophageal reflux disease)   . Herpes zoster   . Hiatal hernia   . Hypertension   . Inflammatory polyps of colon with rectal bleeding ([place])   . Lung cancer ([place])    rt upper lobe lobectomy 2007 radiation seed implants  . Post herpetic neuralgia   . Sinusitis   . Strep pharyngitis   . TIA (transient ischemic attack)    Past Surgical History:  Procedure Laterality Date  . COLONOSCOPY N/A 11/20/2014   Procedure: COLONOSCOPY;  Surgeon: Alex Binder, MD;  Location: [facility];  Service: Endoscopy;  Laterality: N/A;  1300 - moved to 1:15 - office to notify  . LAPAROSCOPIC CHOLECYSTECTOMY  2003  . LUNG REMOVAL, PARTIAL Right    right upper lung lobectomy - radiation seeds implanted   Family History  Problem Relation Age of Onset  . Cancer Mother     Breast Cancer  . Heart disease Mother 59    MI  . Aneurysm Father   . Heart disease  Father   . Alcohol abuse Brother   . Heart disease Brother   . Cancer Sister     breast  . COPD Brother   . Colon cancer Neg Hx    Social History   Occupational History  . Not on file.   Social History Main Topics  . Smoking status: Former Smoker    Types: Cigarettes    Quit date: 01/16/2002  . Smokeless tobacco: Never Used  . Alcohol use No     Comment: Last alcohol use 2003  . Drug use: No  . Sexual activity: Yes    Do you feel safe at home?  No Are there smokers in your home (other than you)? No  Dietary issues and exercise activities: Current Exercise Habits: Home exercise routine, Type of exercise: Other - see comments (biking), Time (Minutes): 30, Frequency (Times/Week): 2, Weekly  Exercise (Minutes/Week): 60, Intensity: Moderate  Current Dietary habits:   Breakfast - usually skips Lunch - around 11am, meat and vegetables Dinner - sandwich  Objective:    Today's Vitals   01/21/16 1231  BP: 122/72  Pulse: 72  Weight: 155 lb (70.3 kg)  Height: '5\' 7"'$  (1.702 m)  PainSc: 0-No pain   Body mass index is 24.28 kg/m.  Activities of Daily Living In your present state of health, do you have any difficulty performing the following activities: 01/21/2016  Hearing? Y  Vision? N  Difficulty concentrating or making decisions? Y  Walking or climbing stairs? N  Dressing or bathing? N  Doing errands, shopping? N  Preparing Food and eating ? N  Using the Toilet? N  In the past six months, have you accidently leaked urine? N  Do you have problems with loss of bowel control? N  Managing your Medications? N  Managing your Finances? N  Housekeeping or managing your Housekeeping? N  Some recent data might be hidden     Cardiac Risk Factors include: advanced age (>51mn, >>75women);family history of premature cardiovascular disease;male gender;hypertension  Depression Screen PHQ 2/9 Scores 01/21/2016 10/24/2015 04/30/2015 02/15/2015  PHQ - 2 Score 0 0 0 0     Fall Risk Fall Risk  01/21/2016 10/24/2015 04/30/2015 02/15/2015 10/02/2014  Falls in the past year? No No No No No    Cognitive Function: MMSE - Mini Mental State Exam 01/21/2016 10/02/2014  Orientation to time 4 5  Orientation to Place 5 5  Registration 3 3  Attention/ Calculation 0 0  Recall 3 2  Language- name 2 objects 2 2  Language- repeat 1 1  Language- follow 3 step command 3 3  Language- read & follow direction 1 1  Write a sentence 1 1  Copy design 1 1  Total score 24 24    Immunizations and Health Maintenance Immunization History  Administered Date(s) Administered  . Influenza,inj,Quad PF,36+ Mos 03/15/2013, 03/20/2014, 03/27/2015  . Pneumococcal Conjugate-13 10/02/2014  . Pneumococcal  Polysaccharide-23 05/31/2012  . Tdap 01/05/2011  . Zoster 08/08/2008   Health Maintenance Due  Topic Date Due  . PNA vac Low Risk Adult (2 of 2 - PPSV23) 10/02/2015  . INFLUENZA VACCINE  01/14/2016    Patient Care Team: JWorthy Rancher MD as PCP - General (Family Medicine) SDanie Binder MD as Consulting Physician (Gastroenterology)  Indicate any recent Medical Services you may have received from other than [facility] providers in the past year (date may be approximate).    Assessment:    Annual Wellness Visit Increased SOB with history of  lung cancer Decreased hearing    Screening Tests Health Maintenance  Topic Date Due  . PNA vac Low Risk Adult (2 of 2 - PPSV23) 10/02/2015  . INFLUENZA VACCINE  01/14/2016  . TETANUS/TDAP  01/04/2021  . ZOSTAVAX  Completed        Plan:   During the course of the visit Said was educated and counseled about the following appropriate screening and preventive services:   Vaccines to include Pneumoccal, Influenza, Td, Zostavax - all vaccines are currently UTD. Reminded patient to get influenza vaccine in Fall  Colorectal cancer screening - UTD  Cardiovascular disease screening - last EKG 2016  BP is at goal today; last LDL was at goal but Tg elevated  Diet - discussed limiting high fat and CHO containing foods - increase fruits and vegetables, lean proteins and low fat dairy.  Also discussed reducing sugar intake and sugar containing beverages.  Diabetes screening - UTD  Discussed SOB with patient's PCP.  Recommended Lung CT due to history of lung cancer (last lung CT 2012)  Advanced Directives - discussed and information provided  Physical Activity - continue to ride bike regularly   Referral to audiologist.   Goals    None       Patient Instructions (the written plan) were given to the patient.   Cherre Robins, PharmD   01/21/2016

## 2016-01-27 ENCOUNTER — Other Ambulatory Visit: Payer: Self-pay

## 2016-01-27 ENCOUNTER — Ambulatory Visit (HOSPITAL_COMMUNITY)
Admission: RE | Admit: 2016-01-27 | Discharge: 2016-01-27 | Disposition: A | Discharge status: Home / Self Care | Payer: Medicare Other | Source: Ambulatory Visit | Attending: Pharmacist | Admitting: Pharmacist

## 2016-01-27 ENCOUNTER — Ambulatory Visit (HOSPITAL_COMMUNITY): Admission: RE | Admit: 2016-01-27 | Payer: Medicare Other | Source: Ambulatory Visit

## 2016-01-27 DIAGNOSIS — I2510 Atherosclerotic heart disease of native coronary artery without angina pectoris: Secondary | ICD-10-CM | POA: Diagnosis not present

## 2016-01-27 DIAGNOSIS — Z85118 Personal history of other malignant neoplasm of bronchus and lung: Secondary | ICD-10-CM | POA: Diagnosis not present

## 2016-01-27 DIAGNOSIS — R0602 Shortness of breath: Secondary | ICD-10-CM

## 2016-01-27 DIAGNOSIS — K449 Diaphragmatic hernia without obstruction or gangrene: Secondary | ICD-10-CM | POA: Diagnosis not present

## 2016-01-27 DIAGNOSIS — I700 Atherosclerosis of aorta: Secondary | ICD-10-CM | POA: Diagnosis not present

## 2016-01-27 DIAGNOSIS — J439 Emphysema, unspecified: Secondary | ICD-10-CM | POA: Insufficient documentation

## 2016-01-27 LAB — POCT I-STAT CREATININE: Creatinine, Ser: 1.2 mg/dL (ref 0.61–1.24)

## 2016-01-27 MED ORDER — IOPAMIDOL (ISOVUE-300) INJECTION 61%
75.0000 mL | Freq: Once | INTRAVENOUS | Status: AC | PRN
  Administered 2016-01-27: 75 mL via INTRAVENOUS

## 2016-02-03 ENCOUNTER — Telehealth: Payer: Self-pay | Admitting: Family Medicine

## 2016-02-03 ENCOUNTER — Encounter: Payer: Self-pay | Admitting: Pharmacist

## 2016-02-03 DIAGNOSIS — I700 Atherosclerosis of aorta: Secondary | ICD-10-CM | POA: Insufficient documentation

## 2016-02-04 NOTE — Telephone Encounter (Signed)
Pt aware of results 

## 2016-02-04 NOTE — Telephone Encounter (Signed)
Stable post treatment changes in right upper lobe. No evidence of recurrent or metastatic carcinoma within the thorax. No other acute findings identified.  Stable emphysema, and moderate hiatal hernia.  Aortic atherosclerosis and coronary artery calcification.    No signs of recurrent cancer, patient has emphysema, patient also has calcifications in his arteries

## 2016-02-08 DIAGNOSIS — H2513 Age-related nuclear cataract, bilateral: Secondary | ICD-10-CM | POA: Diagnosis not present

## 2016-02-08 DIAGNOSIS — H40033 Anatomical narrow angle, bilateral: Secondary | ICD-10-CM | POA: Diagnosis not present

## 2016-02-26 ENCOUNTER — Other Ambulatory Visit: Payer: Self-pay | Admitting: Family Medicine

## 2016-03-10 ENCOUNTER — Ambulatory Visit (INDEPENDENT_AMBULATORY_CARE_PROVIDER_SITE_OTHER): Payer: Medicare Other

## 2016-03-10 DIAGNOSIS — Z23 Encounter for immunization: Secondary | ICD-10-CM

## 2016-03-14 DIAGNOSIS — J069 Acute upper respiratory infection, unspecified: Secondary | ICD-10-CM | POA: Diagnosis not present

## 2016-04-09 ENCOUNTER — Encounter: Payer: Self-pay | Admitting: Pediatrics

## 2016-04-09 ENCOUNTER — Ambulatory Visit (INDEPENDENT_AMBULATORY_CARE_PROVIDER_SITE_OTHER): Payer: Medicare Other | Admitting: Pediatrics

## 2016-04-09 VITALS — BP 112/70 | HR 104 | Temp 98.7°F | Ht 67.0 in | Wt 153.6 lb

## 2016-04-09 DIAGNOSIS — K529 Noninfective gastroenteritis and colitis, unspecified: Secondary | ICD-10-CM | POA: Diagnosis not present

## 2016-04-09 NOTE — Patient Instructions (Signed)
Keep sipping fluids through evening tonight Goal to have you urinating regularly, every 2-3 hours

## 2016-04-09 NOTE — Progress Notes (Signed)
  Subjective:   Patient ID: [name], male    DOB: [birth year], 82 y.o.   MRN: [id number] CC: Emesis and Chills  HPI: TORETTO TINGLER is a 82 y.o. male presenting for Emesis and Chills  Vomited 4 times this morning Ate soup for lunch, vomited it up soon afterwards, looked like the soup NBNB emesis Felt some chills last night, felt like he was starting  Last night ate at [employer] Two other family members who ate with him woke up this morning vomiting  Normal bowel movements past two days, sometimes a little loose Last was this morning, normal  No abd pain No fevers, does feel chills now  Relevant past medical, surgical, family and social history reviewed. Allergies and medications reviewed and updated. History  Smoking Status  . Former Smoker  . Types: Cigarettes  . Quit date: 01/16/2002  Smokeless Tobacco  . Never Used   ROS: Per HPI   Objective:    BP 112/70   Pulse (!) 104   Temp 98.7 F (37.1 C) (Oral)   Ht '5\' 7"'$  (1.702 m)   Wt 153 lb 9.6 oz (69.7 kg)   BMI 24.06 kg/m   Wt Readings from Last 3 Encounters:  04/09/16 153 lb 9.6 oz (69.7 kg)  01/21/16 155 lb (70.3 kg)  10/24/15 157 lb 12.8 oz (71.6 kg)    Gen: NAD, alert, cooperative with exam, well-appearing EYES: EOMI, no conjunctival injection, or no icterus ENT:  TMs pearly gray b/l, OP without erythema LYMPH: no cervical LAD CV: NRRR, normal S1/S2, no murmur, distal pulses 2+ b/l Resp: CTABL, no wheezes, normal WOB Abd: +BS, soft, mildly tender with palpation R side of abd, ND. no guarding or organomegaly, no rebound tenderness Ext: No edema, warm Neuro: Alert and oriented MSK: normal muscle bulk  Assessment & Plan:  Orla was seen today for emesis and chills.  Diagnoses and all orders for this visit:  Gastroenteritis Vs food poisoning Several other family members with similar symptoms Passing stools No abd pain Discussed regular intake of small amount of fluids, must be regularly  urinating Let me know if symptoms worsen Discussed return precautions Hold BP meds tonight and tomorrow morning until feeling better Normal BP today, threw up meds this morning  Follow up plan: Return if symptoms worsen or fail to improve. Assunta Found, MD [place]

## 2016-04-10 ENCOUNTER — Telehealth: Payer: Self-pay | Admitting: Pediatrics

## 2016-04-10 NOTE — Telephone Encounter (Signed)
Patient advised to drink extra fluids, eat lightly and continue to rest.  If diarrhea begins again , can try imodium or Pepto bismuth for relief. Call us if not improving.  Can come to AM clinic if getting worse.

## 2016-04-24 ENCOUNTER — Ambulatory Visit (INDEPENDENT_AMBULATORY_CARE_PROVIDER_SITE_OTHER): Payer: Medicare Other | Admitting: Pediatrics

## 2016-04-24 ENCOUNTER — Encounter: Payer: Self-pay | Admitting: Pediatrics

## 2016-04-24 ENCOUNTER — Other Ambulatory Visit: Payer: Self-pay | Admitting: Family Medicine

## 2016-04-24 VITALS — BP 146/77 | HR 68 | Temp 97.6°F | Ht 67.0 in | Wt 154.0 lb

## 2016-04-24 DIAGNOSIS — J441 Chronic obstructive pulmonary disease with (acute) exacerbation: Secondary | ICD-10-CM

## 2016-04-24 MED ORDER — AZITHROMYCIN 250 MG PO TABS: ORAL_TABLET | ORAL | 0 refills | Status: DC

## 2016-04-24 MED ORDER — PREDNISONE 20 MG PO TABS: 40.0000 mg | ORAL_TABLET | Freq: Every day | ORAL | 0 refills | Status: AC

## 2016-04-24 NOTE — Progress Notes (Signed)
  Subjective:   Patient ID: Alex [name], male    DOB: [birth year], 82 y.o.   MRN: [id number] CC: URI (pressure, HA, cough, congestion, no fever) and Sinusitis  HPI: Alex [name] is a 82 y.o. male presenting for URI (pressure, HA, cough, congestion, no fever) and Sinusitis  started 4 days ago Wheezing when coughing Coughing more than usual Coughing up white sputum Keeps inhaler albuterol with him Using more often, every few hours now Does help with breathing At baseline gets SOB walking around house Recent GI illness, now improved   Relevant past medical, surgical, family and social history reviewed. Allergies and medications reviewed and updated. History  Smoking Status  . Former Smoker  . Types: Cigarettes  . Quit date: 01/16/2002  Smokeless Tobacco  . Never Used   ROS: Per HPI   Objective:    BP (!) 146/77 (BP Location: Left Arm)   Pulse 68   Temp 97.6 F (36.4 C) (Oral)   Ht '5\' 7"'$  (1.702 m)   Wt 154 lb (69.9 kg)   SpO2 92%   BMI 24.12 kg/m   Wt Readings from Last 3 Encounters:  04/24/16 154 lb (69.9 kg)  04/09/16 153 lb 9.6 oz (69.7 kg)  01/21/16 155 lb (70.3 kg)    Gen: NAD, alert, cooperative with exam, NCAT EYES: EOMI, no conjunctival injection, or no icterus ENT:  TMs pearly gray b/l, OP without erythema LYMPH: no cervical LAD CV: NRRR, normal S1/S2, no murmur Resp: moving air fair, comfortable WOB, wheeze with forced exhalation/coughing Abd: +BS, soft, NTND. no guarding or organomegaly Ext: No edema, warm Neuro: Alert and oriented MSK: normal muscle bulk  Assessment & Plan:  Alex [name] was seen today for uri and sinusitis.  Diagnoses and all orders for this visit:  COPD exacerbation ([place]) O2 sats 92% Treat as below Cont albuterol inhaler TID next few days.  Any worsening needs to be seen -     predniSONE (DELTASONE) 20 MG tablet; Take 2 tablets (40 mg total) by mouth daily with breakfast. -     azithromycin (ZITHROMAX) 250 MG tablet; Take 2 the  first day and then one each day after.   Follow up plan: F/u with PCP for PFTs when well Assunta Found, MD [place]

## 2016-04-27 ENCOUNTER — Ambulatory Visit (INDEPENDENT_AMBULATORY_CARE_PROVIDER_SITE_OTHER): Payer: Medicare Other | Admitting: Family Medicine

## 2016-04-27 ENCOUNTER — Encounter: Payer: Self-pay | Admitting: Family Medicine

## 2016-04-27 VITALS — BP 142/62 | HR 62 | Temp 97.0°F | Ht 67.0 in | Wt 151.5 lb

## 2016-04-27 DIAGNOSIS — E785 Hyperlipidemia, unspecified: Secondary | ICD-10-CM | POA: Diagnosis not present

## 2016-04-27 DIAGNOSIS — I10 Essential (primary) hypertension: Secondary | ICD-10-CM

## 2016-04-27 DIAGNOSIS — L820 Inflamed seborrheic keratosis: Secondary | ICD-10-CM | POA: Diagnosis not present

## 2016-04-27 NOTE — Progress Notes (Signed)
BP (!) 142/62   Pulse 62   Temp 97 F (36.1 C) (Oral)   Ht '5\' 7"'$  (1.702 m)   Wt 151 lb 8 oz (68.7 kg)   BMI 23.73 kg/m    Subjective:    Patient ID: Alex [name], male    DOB: 31-Oct-1932, 82 y.o.   MRN: [id number]  HPI: Alex [name] is a 82 y.o. male presenting on 04/27/2016 for Hypertension (pt here today for routine follow up, still having chest congestion and cough, just started taking Zpak Saturday)   HPI Hypertension Patient is coming in for a hypertension check. His blood pressure today is 142/62. He is currently on metoprolol and amlodipine. Patient denies headaches, blurred vision, chest pains, shortness of breath, or weakness. Denies any side effects from medication and is content with current medication.   Hyperlipidemia Patient is coming in for cholesterol recheck as well. He is currently on simvastatin 20 mg for his cholesterol. He denies any myalgias or issues with medication. He denies any focal numbness or weakness or palpitations or chest pain.  Lesion on head Patient has a lesion on the left side of his head just above his forehead where he is bald that has been biopsied previously and was found to be benign but has come back. It told him that was likely an age spot or a seborrheic keratosis and he would like to see if he can get it removed because it has been inflamed and irritated and very pruritic.  Relevant past medical, surgical, family and social history reviewed and updated as indicated. Interim medical history since our last visit reviewed. Allergies and medications reviewed and updated.  Review of Systems  Constitutional: Negative for chills and fever.  Eyes: Negative for discharge.  Respiratory: Negative for shortness of breath and wheezing.   Cardiovascular: Negative for chest pain and leg swelling.  Musculoskeletal: Negative for back pain and gait problem.  Skin: Negative for color change and rash.  Neurological: Negative for dizziness, weakness,  light-headedness, numbness and headaches.  All other systems reviewed and are negative.   Per HPI unless specifically indicated above     Objective:    BP (!) 142/62   Pulse 62   Temp 97 F (36.1 C) (Oral)   Ht '5\' 7"'$  (1.702 m)   Wt 151 lb 8 oz (68.7 kg)   BMI 23.73 kg/m   Wt Readings from Last 3 Encounters:  04/27/16 151 lb 8 oz (68.7 kg)  04/24/16 154 lb (69.9 kg)  04/09/16 153 lb 9.6 oz (69.7 kg)    Physical Exam  Constitutional: He is oriented to person, place, and time. He appears well-developed and well-nourished. No distress.  Eyes: Conjunctivae are normal. Right eye exhibits no discharge. No scleral icterus.  Cardiovascular: Normal rate, regular rhythm, normal heart sounds and intact distal pulses.   No murmur heard. Pulmonary/Chest: Effort normal and breath sounds normal. No respiratory distress. He has no wheezes.  Musculoskeletal: Normal range of motion. He exhibits no edema.  Neurological: He is alert and oriented to person, place, and time. Coordination normal.  Skin: Skin is warm and dry. Lesion (Abnormally shaped irritated seborrheic keratosis on the top left forehead.) noted. No rash noted. He is not diaphoretic.  Psychiatric: He has a normal mood and affect. His behavior is normal.  Nursing note and vitals reviewed.  Cryotherapy: Did 3:15 second bursts of liquid nitrogen on the lesion on his left frontal scalp. Patient tolerated well.    Assessment &  Plan:   Problem List Items Addressed This Visit      Cardiovascular and Mediastinum   Hypertension - Primary     Other   Hyperlipidemia with target LDL less than 100    Other Visit Diagnoses    Inflamed seborrheic keratosis       Patient's for head, had previous biopsy which was negative per him, do not have pathology results because was done at dermatology, cryotherapy today       Follow up plan: Return in about 3 months (around 07/28/2016), or if symptoms worsen or fail to improve, for Hypertension  and fasting labs.  Counseling provided for all of the vaccine components No orders of the defined types were placed in this encounter.   Caryl Pina, MD [facility] 04/27/2016, 10:37 AM

## 2016-07-10 ENCOUNTER — Encounter: Payer: Self-pay | Admitting: Family Medicine

## 2016-07-10 ENCOUNTER — Ambulatory Visit (INDEPENDENT_AMBULATORY_CARE_PROVIDER_SITE_OTHER): Payer: Medicare Other | Admitting: Family Medicine

## 2016-07-10 VITALS — BP 136/75 | HR 85 | Temp 99.3°F | Ht 67.0 in | Wt 152.8 lb

## 2016-07-10 DIAGNOSIS — J111 Influenza due to unidentified influenza virus with other respiratory manifestations: Secondary | ICD-10-CM

## 2016-07-10 MED ORDER — ONDANSETRON HCL 4 MG PO TABS: 4.0000 mg | ORAL_TABLET | Freq: Three times a day (TID) | ORAL | 0 refills | Status: DC | PRN

## 2016-07-10 MED ORDER — PREDNISONE 20 MG PO TABS: 40.0000 mg | ORAL_TABLET | Freq: Every day | ORAL | 0 refills | Status: DC

## 2016-07-10 MED ORDER — OSELTAMIVIR PHOSPHATE 75 MG PO CAPS: 75.0000 mg | ORAL_CAPSULE | Freq: Two times a day (BID) | ORAL | 0 refills | Status: DC

## 2016-07-10 NOTE — Patient Instructions (Signed)
Great to see you!  Start tamiflu and prednisone today, Only take ondansetron if needed for nausea   Influenza, Adult Influenza, more commonly known as "the flu," is a viral infection that primarily affects the respiratory tract. The respiratory tract includes organs that help you breathe, such as the lungs, nose, and throat. The flu causes many common cold symptoms, as well as a high fever and body aches. The flu spreads easily from person to person (is contagious). Getting a flu shot (influenza vaccination) every year is the best way to prevent influenza. What are the causes? Influenza is caused by a virus. You can catch the virus by:  Breathing in droplets from an infected person's cough or sneeze.  Touching something that was recently contaminated with the virus and then touching your mouth, nose, or eyes. What increases the risk? The following factors may make you more likely to get the flu:  Not cleaning your hands frequently with soap and water or alcohol-based hand sanitizer.  Having close contact with many people during cold and flu season.  Touching your mouth, eyes, or nose without washing or sanitizing your hands first.  Not drinking enough fluids or not eating a healthy diet.  Not getting enough sleep or exercise.  Being under a high amount of stress.  Not getting a yearly (annual) flu shot. You may be at a higher risk of complications from the flu, such as a severe lung infection (pneumonia), if you:  Are over the age of 7.  Are pregnant.  Have a weakened disease-fighting system (immune system). You may have a weakened immune system if you:  Have HIV or AIDS.  Are undergoing chemotherapy.  Aretaking medicines that reduce the activity of (suppress) the immune system.  Have a long-term (chronic) illness, such as heart disease, kidney disease, diabetes, or lung disease.  Have a liver disorder.  Are obese.  Have anemia. What are the signs or  symptoms? Symptoms of this condition typically last 4-10 days and may include:  Fever.  Chills.  Headache, body aches, or muscle aches.  Sore throat.  Cough.  Runny or congested nose.  Chest discomfort and cough.  Poor appetite.  Weakness or tiredness (fatigue).  Dizziness.  Nausea or vomiting. How is this diagnosed? This condition may be diagnosed based on your medical history and a physical exam. Your health care provider may do a nose or throat swab test to confirm the diagnosis. How is this treated? If influenza is detected early, you can be treated with antiviral medicine that can reduce the length of your illness and the severity of your symptoms. This medicine may be given by mouth (orally) or through an IV tube that is inserted in one of your veins. The goal of treatment is to relieve symptoms by taking care of yourself at home. This may include taking over-the-counter medicines, drinking plenty of fluids, and adding humidity to the air in your home. In some cases, influenza goes away on its own. Severe influenza or complications from influenza may be treated in a hospital. Follow these instructions at home:  Take over-the-counter and prescription medicines only as told by your health care provider.  Use a cool mist humidifier to add humidity to the air in your home. This can make breathing easier.  Rest as needed.  Drink enough fluid to keep your urine clear or pale yellow.  Cover your mouth and nose when you cough or sneeze.  Wash your hands with soap and water often, especially  after you cough or sneeze. If soap and water are not available, use hand sanitizer.  Stay home from work or school as told by your health care provider. Unless you are visiting your health care provider, try to avoid leaving home until your fever has been gone for 24 hours without the use of medicine.  Keep all follow-up visits as told by your health care provider. This is  important. How is this prevented?  Getting an annual flu shot is the best way to avoid getting the flu. You may get the flu shot in late summer, fall, or winter. Ask your health care provider when you should get your flu shot.  Wash your hands often or use hand sanitizer often.  Avoid contact with people who are sick during cold and flu season.  Eat a healthy diet, drink plenty of fluids, get enough sleep, and exercise regularly. Contact a health care provider if:  You develop new symptoms.  You have:  Chest pain.  Diarrhea.  A fever.  Your cough gets worse.  You produce more mucus.  You feel nauseous or you vomit. Get help right away if:  You develop shortness of breath or difficulty breathing.  Your skin or nails turn a bluish color.  You have severe pain or stiffness in your neck.  You develop a sudden headache or sudden pain in your face or ear.  You cannot stop vomiting. This information is not intended to replace advice given to you by your health care provider. Make sure you discuss any questions you have with your health care provider. Document Released: 05/29/2000 Document Revised: 11/07/2015 Document Reviewed: 03/26/2015 Elsevier Interactive Patient Education  2017 [employer].

## 2016-07-10 NOTE — Progress Notes (Signed)
   HPI  Patient presents today here with flulike symptoms.  Patient explains that last night he began having cough, congestion, nausea, and bodyaches. He's had chills with subjective fever. He has malaise.  He also notes slightly worsening shortness of breath and wheezing.  He has a history of COPD. He has done well on prednisone previously  PMH: Smoking status noted ROS: Per HPI  Objective: BP 136/75   Pulse 85   Temp 99.3 F (37.4 C) (Oral)   Ht '5\' 7"'$  (1.702 m)   Wt 152 lb 12.8 oz (69.3 kg)   BMI 23.93 kg/m  Gen: NAD, alert, cooperative with exam HEENT: NCAT, oropharynx clear, nares clear CV: RRR, good S1/S2, no murmur Resp: CTABL, no wheezes, non-labored Ext: No edema, warm Neuro: Alert and oriented, No gross deficits  Assessment and plan:  # Influenza Clinical diagnosis, less than 18 hours onset With some increased wheezing (subjective) and history of COPD, added prednisone Also given Zofran for nausea which she is having nausea today. Discussed supportive care Very low threshold for follow-up as he has a very high risk for having complications from influenza with COPD and age of 74.    Meds ordered this encounter  Medications  . oseltamivir (TAMIFLU) 75 MG capsule    Sig: Take 1 capsule (75 mg total) by mouth 2 (two) times daily.    Dispense:  10 capsule    Refill:  0  . ondansetron (ZOFRAN) 4 MG tablet    Sig: Take 1 tablet (4 mg total) by mouth every 8 (eight) hours as needed for nausea or vomiting.    Dispense:  20 tablet    Refill:  0  . predniSONE (DELTASONE) 20 MG tablet    Sig: Take 2 tablets (40 mg total) by mouth daily with breakfast.    Dispense:  14 tablet    Refill:  0    Laroy Apple, MD [facility] 07/10/2016, 3:10 PM

## 2016-07-21 ENCOUNTER — Encounter: Payer: Self-pay | Admitting: Family Medicine

## 2016-07-21 ENCOUNTER — Ambulatory Visit (INDEPENDENT_AMBULATORY_CARE_PROVIDER_SITE_OTHER): Payer: Medicare Other | Admitting: Family Medicine

## 2016-07-21 VITALS — BP 125/64 | HR 75 | Temp 98.1°F | Ht 67.0 in | Wt 150.0 lb

## 2016-07-21 DIAGNOSIS — R05 Cough: Secondary | ICD-10-CM

## 2016-07-21 DIAGNOSIS — J45909 Unspecified asthma, uncomplicated: Secondary | ICD-10-CM

## 2016-07-21 DIAGNOSIS — R059 Cough, unspecified: Secondary | ICD-10-CM

## 2016-07-21 MED ORDER — AZITHROMYCIN 250 MG PO TABS: ORAL_TABLET | ORAL | 0 refills | Status: DC

## 2016-07-21 MED ORDER — METHYLPREDNISOLONE ACETATE 80 MG/ML IJ SUSP
40.0000 mg | Freq: Once | INTRAMUSCULAR | Status: AC
  Administered 2016-07-21: 40 mg via INTRAMUSCULAR

## 2016-07-21 NOTE — Progress Notes (Signed)
Subjective:    Patient ID: Alex [name], male    DOB: [birth year], 83 y.o.   MRN: [id number]  HPI Patient here today for continued cough and congestion since his recent dx of influenza. Patient has not felt well since he was treated for the flu with Tamiflu. Complains of tightness in his chest congestion and cough which is nonproductive. Also complains of some sinus pressure and runny nose and sneezing. He has a history of COPD and just finished course of prednisone also.    Patient Active Problem List   Diagnosis Date Noted  . Aortic atherosclerosis ([place]) 02/03/2016  . History of colonic polyps 10/24/2014  . BPH (benign prostatic hyperplasia) 08/31/2014  . Allergic rhinitis 10/05/2013  . Hyperlipidemia with target LDL less than 100 07/27/2013  . Hypertension 07/27/2013  . GERD (gastroesophageal reflux disease) 07/27/2013  . Dementia 07/27/2013   Outpatient Encounter Prescriptions as of 07/21/2016  Medication Sig  . amLODipine (NORVASC) 5 MG tablet TAKE ONE (1) TABLET EACH DAY  . aspirin 81 MG tablet Take 81 mg by mouth daily.    . cetirizine (ZYRTEC) 10 MG tablet TAKE ONE (1) TABLET EACH DAY  . esomeprazole (NEXIUM) 40 MG capsule TAKE ONE (1) CAPSULE EACH DAY  . fluticasone (FLONASE) 50 MCG/ACT nasal spray Place 2 sprays into both nostrils daily.  . memantine (NAMENDA) 10 MG tablet TAKE ONE TABLET BY MOUTH TWICE DAILY  . metoprolol (LOPRESSOR) 50 MG tablet TAKE ONE TABLET BY MOUTH TWICE DAILY  . Multiple Vitamins-Minerals (CENTRUM SILVER ADULT 50+ PO) Take by mouth daily.  . ondansetron (ZOFRAN) 4 MG tablet Take 1 tablet (4 mg total) by mouth every 8 (eight) hours as needed for nausea or vomiting.  Marland Kitchen PROAIR HFA 108 (90 Base) MCG/ACT inhaler USE 2 PUFFS 4 TIMES DAILY  . simvastatin (ZOCOR) 20 MG tablet TAKE ONE TABLET DAILY AT BEDTIME  . [DISCONTINUED] oseltamivir (TAMIFLU) 75 MG capsule Take 1 capsule (75 mg total) by mouth 2 (two) times daily.  . [DISCONTINUED] predniSONE  (DELTASONE) 20 MG tablet Take 2 tablets (40 mg total) by mouth daily with breakfast.  . [DISCONTINUED] azithromycin (ZITHROMAX) 250 MG tablet Take 2 the first day and then one each day after.   No facility-administered encounter medications on file as of 07/21/2016.       Review of Systems  Constitutional: Negative.  Negative for fever.  HENT: Positive for congestion, postnasal drip and sinus pressure.   Eyes: Negative.   Respiratory: Positive for cough and chest tightness.   Cardiovascular: Negative.   Gastrointestinal: Negative.   Endocrine: Negative.   Genitourinary: Negative.   Musculoskeletal: Negative.   Skin: Negative.   Allergic/Immunologic: Negative.   Neurological: Negative.   Hematological: Negative.   Psychiatric/Behavioral: Negative.        Objective:   Physical Exam  Constitutional: He is oriented to person, place, and time. He appears well-developed and well-nourished.  HENT:  Mouth/Throat: Oropharynx is clear and moist.  Cardiovascular: Normal rate, regular rhythm and normal heart sounds.   Pulmonary/Chest: Effort normal. He has wheezes.  Neurological: He is alert and oriented to person, place, and time.   BP 125/64 (BP Location: Right Arm)   Pulse 75   Temp 98.1 F (36.7 C) (Oral)   Ht '5\' 7"'$  (1.702 m)   Wt 150 lb (68 kg)   BMI 23.49 kg/m         Assessment & Plan:  1. Cough This is likely the result of having had flu  and COPD - methylPREDNISolone acetate (DEPO-MEDROL) injection 40 mg; Inject 0.5 mLs (40 mg total) into the muscle once.  2. Mild reactive airways disease, unspecified whether persistent With wheezes and productive sounding cough will treat as bronchitis with Z-Pak.  Wardell Honour MD

## 2016-07-24 ENCOUNTER — Ambulatory Visit (INDEPENDENT_AMBULATORY_CARE_PROVIDER_SITE_OTHER): Payer: Medicare Other | Admitting: Family Medicine

## 2016-07-24 ENCOUNTER — Encounter: Payer: Self-pay | Admitting: Family Medicine

## 2016-07-24 VITALS — BP 127/65 | HR 77 | Temp 97.3°F | Ht 67.0 in | Wt 144.0 lb

## 2016-07-24 DIAGNOSIS — J439 Emphysema, unspecified: Secondary | ICD-10-CM | POA: Insufficient documentation

## 2016-07-24 DIAGNOSIS — Z85118 Personal history of other malignant neoplasm of bronchus and lung: Secondary | ICD-10-CM | POA: Diagnosis not present

## 2016-07-24 DIAGNOSIS — R0602 Shortness of breath: Secondary | ICD-10-CM | POA: Insufficient documentation

## 2016-07-24 DIAGNOSIS — J441 Chronic obstructive pulmonary disease with (acute) exacerbation: Secondary | ICD-10-CM | POA: Insufficient documentation

## 2016-07-24 MED ORDER — BETAMETHASONE SOD PHOS & ACET 6 (3-3) MG/ML IJ SUSP
6.0000 mg | Freq: Once | INTRAMUSCULAR | Status: AC
  Administered 2016-07-24: 6 mg via INTRAMUSCULAR

## 2016-07-24 MED ORDER — UMECLIDINIUM-VILANTEROL 62.5-25 MCG/INH IN AEPB: INHALATION_SPRAY | RESPIRATORY_TRACT | 11 refills | Status: DC

## 2016-07-24 MED ORDER — LEVOFLOXACIN 500 MG PO TABS: 500.0000 mg | ORAL_TABLET | Freq: Every day | ORAL | 0 refills | Status: DC

## 2016-07-24 MED ORDER — TRAZODONE HCL 50 MG PO TABS: ORAL_TABLET | ORAL | 1 refills | Status: DC

## 2016-07-24 NOTE — Patient Instructions (Signed)
You should discontinue the azithromycin. Take the new prescription for levofloxacin instead.  Use the new inhaler,ANORO, once a day and only once a day every day.  Use your albuterol inhaler 2 puffs 4 times a day every day until you are feeling better.  Use trazodone every night to help with sleep as needed.

## 2016-07-24 NOTE — Progress Notes (Signed)
Subjective:  Patient ID: Alex [name], male    DOB: 05/06/1933  Age: 83 y.o. MRN: [id number]  CC: Nasal Congestion (Diagnosed with flu on 1-29); Shortness of Breath; and Fatigue   HPI Alex [name] presents for 2 weeks of increasing cough and shortness of breath. Cough is Nonproductive. He has had chills and felt hot. No confirmed fever. He went through a course of Tamiflu without improvement. Came back 3 days ago and was given Z-Pak. So far that has not helped. He continues to get more short of breath. He was given a Depo-Medrol injection 3 days ago. Wife says that it is keeping him from sleeping. Patient had lung cancer diagnosed in 2009. He had a right upper lobe lobectomy. PT has had a CT scan of the chest in August of last year showing no signs of recurrence. It confirmed the presence of emphysema.   History Alex [name] has a past medical history of Alcohol abuse; Allergy; Asthma; COPD, moderate ([place]); Dementia; Dementia; Duodenitis; Esophageal stricture; GERD (gastroesophageal reflux disease); Herpes zoster; Hiatal hernia; Hypertension; Inflammatory polyps of colon with rectal bleeding ([place]); Lung cancer ([place]); Post herpetic neuralgia; Renal cyst, right; Sinusitis; Strep pharyngitis; and TIA (transient ischemic attack).   He has a past surgical history that includes Laparoscopic cholecystectomy (2003); Lung removal, partial (Right); and Colonoscopy (N/A, 11/20/2014).   His family history includes Alcohol abuse in his brother; Aneurysm in his father; COPD in his brother; Cancer in his mother and sister; Heart disease in his brother and father; Heart disease (age of onset: 45) in his mother.He reports that he quit smoking about 14 years ago. His smoking use included Cigarettes. He has never used smokeless tobacco. He reports that he does not drink alcohol or use drugs.    ROS Review of Systems  Constitutional: Negative for chills, diaphoresis and fever.  HENT: Positive for congestion. Negative  for rhinorrhea and sore throat.   Respiratory: Positive for cough, shortness of breath and wheezing.   Cardiovascular: Negative for chest pain.  Gastrointestinal: Negative for abdominal pain.  Musculoskeletal: Negative for arthralgias and myalgias.  Skin: Negative for rash.  Neurological: Positive for weakness. Negative for headaches.    Objective:  BP 127/65   Pulse 77   Temp 97.3 F (36.3 C) (Oral)   Ht 5' 7"  (1.702 m)   Wt 144 lb (65.3 kg)   SpO2 94%   BMI 22.55 kg/m   BP Readings from Last 3 Encounters:  07/24/16 127/65  07/21/16 125/64  07/10/16 136/75    Wt Readings from Last 3 Encounters:  07/24/16 144 lb (65.3 kg)  07/21/16 150 lb (68 kg)  07/10/16 152 lb 12.8 oz (69.3 kg)     Physical Exam  Constitutional: He appears well-developed and well-nourished.  HENT:  Head: Normocephalic and atraumatic.  Right Ear: Tympanic membrane and external ear normal. No decreased hearing is noted.  Left Ear: Tympanic membrane and external ear normal. No decreased hearing is noted.  Mouth/Throat: No oropharyngeal exudate or posterior oropharyngeal erythema.  Eyes: Pupils are equal, round, and reactive to light.  Neck: Normal range of motion. Neck supple.  Cardiovascular: Normal rate and regular rhythm.   No murmur heard. Pulmonary/Chest: He is in respiratory distress. He has wheezes. He has no rales. He exhibits no tenderness.  Patient's chest is almost silent. He has some wheezes and some distant breath sounds with very poor exchange.  Abdominal: Soft. There is no tenderness.  Vitals reviewed.   Ct Chest W Contrast  Result Date: 01/27/2016 CLINICAL DATA:  Chronic shortness of breath. Right lung carcinoma treated with surgery and brachytherapy seeds. EXAM: CT CHEST WITH CONTRAST TECHNIQUE: Multidetector CT imaging of the chest was performed during intravenous contrast administration. CONTRAST:  10m ISOVUE-300 IOPAMIDOL (ISOVUE-300) INJECTION 61% COMPARISON:  08/06/2010  FINDINGS: Cardiovascular: Normal heart size. Three-vessel coronary artery calcification. Aortic atherosclerosis. Mediastinum/Nodes: No lymphadenopathy identified. Moderate hiatal hernia. Lungs/Pleura: Moderate to severe centrilobular emphysema. Brachytherapy seeds and associated scarring in the right upper lobe appears stable. No suspicious pulmonary nodules or masses identified. No evidence of pulmonary infiltrate or pleural effusion. Upper Abdomen: No acute findings. Normal adrenal glands. Right renal cyst again noted. Musculoskeletal: No chest wall mass or suspicious bone lesions identified. IMPRESSION: Stable post treatment changes in right upper lobe. No evidence of recurrent or metastatic carcinoma within the thorax. No other acute findings identified. Stable emphysema, and moderate hiatal hernia. Aortic atherosclerosis and coronary artery calcification. Electronically Signed   By: JEarle GellM.D.   On: 01/27/2016 15:39    Assessment & Plan:   EBrodenwas seen today for nasal congestion, shortness of breath and fatigue.  Diagnoses and all orders for this visit:  COPD exacerbation ([place] -     CBC with Differential/Platelet -     CMP14+EGFR -     betamethasone acetate-betamethasone sodium phosphate (CELESTONE) injection 6 mg; Inject 1 mL (6 mg total) into the muscle once.  SOB (shortness of breath)  History of lung cancer  Pulmonary emphysema, unspecified emphysema type ([place]  Other orders -     levofloxacin (LEVAQUIN) 500 MG tablet; Take 1 tablet (500 mg total) by mouth daily. -     umeclidinium-vilanterol (ANORO ELLIPTA) 62.5-25 MCG/INH AEPB; One puff daily -     traZODone (DESYREL) 50 MG tablet; Take 1 or 2 nightly as needed for sleep     I have discontinued [name]'s azithromycin. I am also having him start on levofloxacin, umeclidinium-vilanterol, and traZODone. Additionally, I am having him maintain his aspirin, fluticasone, Multiple Vitamins-Minerals (CENTRUM SILVER ADULT 50+  PO), cetirizine, PROAIR HFA, amLODipine, simvastatin, esomeprazole, metoprolol, memantine, and ondansetron. We will continue to administer betamethasone acetate-betamethasone sodium phosphate.  Allergies as of 07/24/2016   No Known Allergies     Medication List       Accurate as of 07/24/16  6:44 PM. Always use your most recent med list.          amLODipine 5 MG tablet Commonly known as:  NORVASC TAKE ONE (1) TABLET EACH DAY   aspirin 81 MG tablet Take 81 mg by mouth daily.   CENTRUM SILVER ADULT 50+ PO Take by mouth daily.   cetirizine 10 MG tablet Commonly known as:  ZYRTEC TAKE ONE (1) TABLET EACH DAY   esomeprazole 40 MG capsule Commonly known as:  NEXIUM TAKE ONE (1) CAPSULE EACH DAY   fluticasone 50 MCG/ACT nasal spray Commonly known as:  FLONASE Place 2 sprays into both nostrils daily.   levofloxacin 500 MG tablet Commonly known as:  LEVAQUIN Take 1 tablet (500 mg total) by mouth daily.   memantine 10 MG tablet Commonly known as:  NAMENDA TAKE ONE TABLET BY MOUTH TWICE DAILY   metoprolol 50 MG tablet Commonly known as:  LOPRESSOR TAKE ONE TABLET BY MOUTH TWICE DAILY   ondansetron 4 MG tablet Commonly known as:  ZOFRAN Take 1 tablet (4 mg total) by mouth every 8 (eight) hours as needed for nausea or vomiting.   PROAIR HFA 108 (90 Base) MCG/ACT  inhaler Generic drug:  albuterol USE 2 PUFFS 4 TIMES DAILY   simvastatin 20 MG tablet Commonly known as:  ZOCOR TAKE ONE TABLET DAILY AT BEDTIME   traZODone 50 MG tablet Commonly known as:  DESYREL Take 1 or 2 nightly as needed for sleep   umeclidinium-vilanterol 62.5-25 MCG/INH Aepb Commonly known as:  ANORO ELLIPTA One puff daily        Follow-up: Return in about 4 days (around 07/28/2016), or if symptoms worsen or fail to improve.  Claretta Fraise, M.D.

## 2016-07-26 LAB — CBC WITH DIFFERENTIAL/PLATELET
BASOS ABS: 0 10*3/uL (ref 0.0–0.2)
Basos: 0 %
EOS (ABSOLUTE): 0.2 10*3/uL (ref 0.0–0.4)
Eos: 2 %
HEMOGLOBIN: 11.4 g/dL — AB (ref 13.0–17.7)
Hematocrit: 34.3 % — ABNORMAL LOW (ref 37.5–51.0)
Immature Grans (Abs): 0 10*3/uL (ref 0.0–0.1)
Immature Granulocytes: 0 %
LYMPHS ABS: 1.7 10*3/uL (ref 0.7–3.1)
LYMPHS: 11 %
MCH: 30.4 pg (ref 26.6–33.0)
MCHC: 33.2 g/dL (ref 31.5–35.7)
MCV: 92 fL (ref 79–97)
MONOCYTES: 13 %
Monocytes Absolute: 1.9 10*3/uL — ABNORMAL HIGH (ref 0.1–0.9)
Neutrophils Absolute: 11 10*3/uL — ABNORMAL HIGH (ref 1.4–7.0)
Neutrophils: 74 %
PLATELETS: 277 10*3/uL (ref 150–379)
RBC: 3.75 x10E6/uL — AB (ref 4.14–5.80)
RDW: 13.6 % (ref 12.3–15.4)
WBC: 14.8 10*3/uL — AB (ref 3.4–10.8)

## 2016-07-26 LAB — CMP14+EGFR
ALBUMIN: 3.7 g/dL (ref 3.5–4.7)
ALK PHOS: 78 IU/L (ref 39–117)
ALT: 11 IU/L (ref 0–44)
AST: 16 IU/L (ref 0–40)
Albumin/Globulin Ratio: 1.2 (ref 1.2–2.2)
BILIRUBIN TOTAL: 0.6 mg/dL (ref 0.0–1.2)
BUN/Creatinine Ratio: 12 (ref 10–24)
BUN: 12 mg/dL (ref 8–27)
CHLORIDE: 90 mmol/L — AB (ref 96–106)
CO2: 24 mmol/L (ref 18–29)
CREATININE: 1.02 mg/dL (ref 0.76–1.27)
Calcium: 8.9 mg/dL (ref 8.6–10.2)
GFR calc Af Amer: 79 mL/min/{1.73_m2} (ref >59)
GFR calc non Af Amer: 68 mL/min/{1.73_m2} (ref >59)
GLUCOSE: 95 mg/dL (ref 65–99)
Globulin, Total: 3 g/dL (ref 1.5–4.5)
Potassium: 4.4 mmol/L (ref 3.5–5.2)
Sodium: 131 mmol/L — ABNORMAL LOW (ref 134–144)
Total Protein: 6.7 g/dL (ref 6.0–8.5)

## 2016-07-27 NOTE — Progress Notes (Signed)
Please contact the patient regarding: His white blood count is high. This is consistent with a bacterial infection. The levofloxacin should help. Please finish the course with that.

## 2016-07-29 ENCOUNTER — Ambulatory Visit (INDEPENDENT_AMBULATORY_CARE_PROVIDER_SITE_OTHER): Payer: Medicare Other | Admitting: Family Medicine

## 2016-07-29 ENCOUNTER — Ambulatory Visit (INDEPENDENT_AMBULATORY_CARE_PROVIDER_SITE_OTHER): Payer: Medicare Other

## 2016-07-29 ENCOUNTER — Encounter: Payer: Self-pay | Admitting: Family Medicine

## 2016-07-29 VITALS — BP 125/78 | HR 85 | Temp 97.0°F | Ht 67.0 in | Wt 142.8 lb

## 2016-07-29 DIAGNOSIS — R350 Frequency of micturition: Secondary | ICD-10-CM

## 2016-07-29 DIAGNOSIS — E785 Hyperlipidemia, unspecified: Secondary | ICD-10-CM

## 2016-07-29 DIAGNOSIS — J441 Chronic obstructive pulmonary disease with (acute) exacerbation: Secondary | ICD-10-CM

## 2016-07-29 DIAGNOSIS — I10 Essential (primary) hypertension: Secondary | ICD-10-CM | POA: Diagnosis not present

## 2016-07-29 DIAGNOSIS — N401 Enlarged prostate with lower urinary tract symptoms: Secondary | ICD-10-CM

## 2016-07-29 MED ORDER — LEVOFLOXACIN 500 MG PO TABS: 500.0000 mg | ORAL_TABLET | Freq: Every day | ORAL | 0 refills | Status: DC

## 2016-07-29 MED ORDER — METHYLPREDNISOLONE ACETATE 80 MG/ML IJ SUSP
80.0000 mg | Freq: Once | INTRAMUSCULAR | Status: AC
  Administered 2016-07-29: 80 mg via INTRAMUSCULAR

## 2016-07-29 MED ORDER — PREDNISONE 20 MG PO TABS: ORAL_TABLET | ORAL | 0 refills | Status: DC

## 2016-07-29 NOTE — Progress Notes (Signed)
BP 125/78   Pulse 85   Temp 97 F (36.1 C) (Oral)   Ht 5' 7"  (1.702 m)   Wt 142 lb 12.8 oz (64.8 kg)   BMI 22.37 kg/m    Subjective:    Patient ID: Alex [name], male    DOB: Jul 09, 1932, 83 y.o.   MRN: [id number]  HPI: Alex [name] is a 83 y.o. male presenting on 07/29/2016 for Hypertension (3 month recheck); Hyperlipidemia; and COPD   HPI Hypertension recheck Patient is coming in today for hypertension recheck. His blood pressure today is 125/78. He was seen recently and had labs done about a week ago testing both his chem and CBC. He currently is on Norvasc and metoprolol. Patient denies headaches, blurred vision, chest pains, shortness of breath, or weakness. Denies any side effects from medication and is content with current medication.   BPH recheck and cholesterol recheck Patient is coming in for prostate rechecking cholesterol recheck. He denies any issues with either and says his medications are doing well for both. He is coming in for lab value rechecked. He denies any urinary issues or myalgias.  Shortness of breath and cough and wheezing Patient has been having shortness of breath and wheezing and coughing that is been fighting off and on for about the past 3 or 4 weeks. He has, and was started on Z-Pak and given prednisone and was getting somewhat better but completely he was more recently seen last week and was started on anoro and given Levaquin and prednisone. He says he still having significant coughing issues and some shortness of breath his cough is productive but sometimes and he has been using his albuterol inhaler which helps a breaking up a little bit. He is also been using Mucinex. He says he gets short of breath and wheezy at times.  Relevant past medical, surgical, family and social history reviewed and updated as indicated. Interim medical history since our last visit reviewed. Allergies and medications reviewed and updated.  Review of Systems    Constitutional: Negative for chills and fever.  HENT: Positive for congestion, postnasal drip, rhinorrhea, sinus pressure, sneezing and sore throat. Negative for ear discharge, ear pain and voice change.   Eyes: Negative for pain, discharge, redness and visual disturbance.  Respiratory: Positive for cough, shortness of breath and wheezing.   Cardiovascular: Negative for chest pain and leg swelling.  Musculoskeletal: Negative for gait problem.  Skin: Negative for rash.  All other systems reviewed and are negative.   Per HPI unless specifically indicated above   Allergies as of 07/29/2016   No Known Allergies     Medication List       Accurate as of 07/29/16 10:34 AM. Always use your most recent med list.          amLODipine 5 MG tablet Commonly known as:  NORVASC TAKE ONE (1) TABLET EACH DAY   aspirin 81 MG tablet Take 81 mg by mouth daily.   CENTRUM SILVER ADULT 50+ PO Take by mouth daily.   cetirizine 10 MG tablet Commonly known as:  ZYRTEC TAKE ONE (1) TABLET EACH DAY   esomeprazole 40 MG capsule Commonly known as:  NEXIUM TAKE ONE (1) CAPSULE EACH DAY   fluticasone 50 MCG/ACT nasal spray Commonly known as:  FLONASE Place 2 sprays into both nostrils daily.   levofloxacin 500 MG tablet Commonly known as:  LEVAQUIN Take 1 tablet (500 mg total) by mouth daily.   levofloxacin 500 MG tablet Commonly  known as:  LEVAQUIN Take 1 tablet (500 mg total) by mouth daily. Take daily after he finishes the current course.   memantine 10 MG tablet Commonly known as:  NAMENDA TAKE ONE TABLET BY MOUTH TWICE DAILY   metoprolol 50 MG tablet Commonly known as:  LOPRESSOR TAKE ONE TABLET BY MOUTH TWICE DAILY   ondansetron 4 MG tablet Commonly known as:  ZOFRAN Take 1 tablet (4 mg total) by mouth every 8 (eight) hours as needed for nausea or vomiting.   predniSONE 20 MG tablet Commonly known as:  DELTASONE Take 3 tabs daily for 1 week, then 2 tabs daily for week 2, then  1 tab daily for week 3.   PROAIR HFA 108 (90 Base) MCG/ACT inhaler Generic drug:  albuterol USE 2 PUFFS 4 TIMES DAILY   simvastatin 20 MG tablet Commonly known as:  ZOCOR TAKE ONE TABLET DAILY AT BEDTIME   traZODone 50 MG tablet Commonly known as:  DESYREL Take 1 or 2 nightly as needed for sleep   umeclidinium-vilanterol 62.5-25 MCG/INH Aepb Commonly known as:  ANORO ELLIPTA One puff daily          Objective:    BP 125/78   Pulse 85   Temp 97 F (36.1 C) (Oral)   Ht 5' 7"  (1.702 m)   Wt 142 lb 12.8 oz (64.8 kg)   BMI 22.37 kg/m   Wt Readings from Last 3 Encounters:  07/29/16 142 lb 12.8 oz (64.8 kg)  07/24/16 144 lb (65.3 kg)  07/21/16 150 lb (68 kg)    Physical Exam  Constitutional: He is oriented to person, place, and time. He appears well-developed and well-nourished. No distress.  HENT:  Right Ear: Tympanic membrane, external ear and ear canal normal.  Left Ear: Tympanic membrane, external ear and ear canal normal.  Nose: Mucosal edema and rhinorrhea present. No sinus tenderness. No epistaxis. Right sinus exhibits maxillary sinus tenderness. Right sinus exhibits no frontal sinus tenderness. Left sinus exhibits maxillary sinus tenderness. Left sinus exhibits no frontal sinus tenderness.  Mouth/Throat: Uvula is midline and mucous membranes are normal. Posterior oropharyngeal edema and posterior oropharyngeal erythema present. No oropharyngeal exudate or tonsillar abscesses.  Eyes: Conjunctivae and EOM are normal. Pupils are equal, round, and reactive to light. Right eye exhibits no discharge. No scleral icterus.  Neck: Neck supple. No thyromegaly present.  Cardiovascular: Normal rate, regular rhythm, normal heart sounds and intact distal pulses.   No murmur heard. Pulmonary/Chest: Effort normal. No respiratory distress. He has no decreased breath sounds. He has wheezes in the right upper field and the left upper field. He has rhonchi. He has no rales.    Musculoskeletal: Normal range of motion. He exhibits no edema.  Lymphadenopathy:    He has no cervical adenopathy.  Neurological: He is alert and oriented to person, place, and time. Coordination normal.  Skin: Skin is warm and dry. No rash noted. He is not diaphoretic.  Psychiatric: He has a normal mood and affect. His behavior is normal.  Nursing note and vitals reviewed.   Results for orders placed or performed in visit on 07/24/16  CBC with Differential/Platelet  Result Value Ref Range   WBC 14.8 (H) 3.4 - 10.8 x10E3/uL   RBC 3.75 (L) 4.14 - 5.80 x10E6/uL   Hemoglobin 11.4 (L) 13.0 - 17.7 g/dL   Hematocrit 34.3 (L) 37.5 - 51.0 %   MCV 92 79 - 97 fL   MCH 30.4 26.6 - 33.0 pg   MCHC 33.2  31.5 - 35.7 g/dL   RDW 13.6 12.3 - 15.4 %   Platelets 277 150 - 379 x10E3/uL   Neutrophils 74 Not Estab. %   Lymphs 11 Not Estab. %   Monocytes 13 Not Estab. %   Eos 2 Not Estab. %   Basos 0 Not Estab. %   Neutrophils Absolute 11.0 (H) 1.4 - 7.0 x10E3/uL   Lymphocytes Absolute 1.7 0.7 - 3.1 x10E3/uL   Monocytes Absolute 1.9 (H) 0.1 - 0.9 x10E3/uL   EOS (ABSOLUTE) 0.2 0.0 - 0.4 x10E3/uL   Basophils Absolute 0.0 0.0 - 0.2 x10E3/uL   Immature Granulocytes 0 Not Estab. %   Immature Grans (Abs) 0.0 0.0 - 0.1 x10E3/uL  CMP14+EGFR  Result Value Ref Range   Glucose 95 65 - 99 mg/dL   BUN 12 8 - 27 mg/dL   Creatinine, Ser 1.02 0.76 - 1.27 mg/dL   GFR calc non Af Amer 68 >59 mL/min/1.73   GFR calc Af Amer 79 >59 mL/min/1.73   BUN/Creatinine Ratio 12 10 - 24   Sodium 131 (L) 134 - 144 mmol/L   Potassium 4.4 3.5 - 5.2 mmol/L   Chloride 90 (L) 96 - 106 mmol/L   CO2 24 18 - 29 mmol/L   Calcium 8.9 8.6 - 10.2 mg/dL   Total Protein 6.7 6.0 - 8.5 g/dL   Albumin 3.7 3.5 - 4.7 g/dL   Globulin, Total 3.0 1.5 - 4.5 g/dL   Albumin/Globulin Ratio 1.2 1.2 - 2.2   Bilirubin Total 0.6 0.0 - 1.2 mg/dL   Alkaline Phosphatase 78 39 - 117 IU/L   AST 16 0 - 40 IU/L   ALT 11 0 - 44 IU/L    Chest  x-ray:Read by radiologist as patchy increased density in right lower lobe that could be atelectasis or early pneumonia. Also has COPD changes.    Assessment & Plan:   Problem List Items Addressed This Visit      Cardiovascular and Mediastinum   Hypertension - Primary     Respiratory   COPD exacerbation ([place])   Relevant Medications   methylPREDNISolone acetate (DEPO-MEDROL) injection 80 mg (Start on 07/29/2016 10:45 AM)   predniSONE (DELTASONE) 20 MG tablet   levofloxacin (LEVAQUIN) 500 MG tablet   Other Relevant Orders   DG Chest 2 View     Genitourinary   BPH (benign prostatic hyperplasia)   Relevant Orders   PSA, total and free     Other   Hyperlipidemia with target LDL less than 100   Relevant Orders   Lipid panel       Follow up plan: Return in about 6 months (around 01/26/2017), or if symptoms worsen or fail to improve, for Recheck hypertension and cholesterol and BPH.  Counseling provided for all of the vaccine components Orders Placed This Encounter  Procedures  . DG Chest 2 View  . Lipid panel  . PSA, total and free    Caryl Pina, MD [facility] 07/29/2016, 10:34 AM

## 2016-07-30 LAB — LIPID PANEL
CHOLESTEROL TOTAL: 128 mg/dL (ref 100–199)
Chol/HDL Ratio: 2.3 ratio units (ref 0.0–5.0)
HDL: 56 mg/dL (ref >39)
LDL Calculated: 57 mg/dL (ref 0–99)
Triglycerides: 75 mg/dL (ref 0–149)
VLDL CHOLESTEROL CAL: 15 mg/dL (ref 5–40)

## 2016-07-30 LAB — PSA, TOTAL AND FREE
PROSTATE SPECIFIC AG, SERUM: 1 ng/mL (ref 0.0–4.0)
PSA FREE PCT: 21 %
PSA FREE: 0.21 ng/mL

## 2016-08-26 ENCOUNTER — Other Ambulatory Visit: Payer: Self-pay | Admitting: Family Medicine

## 2016-08-26 ENCOUNTER — Other Ambulatory Visit (INDEPENDENT_AMBULATORY_CARE_PROVIDER_SITE_OTHER): Payer: Medicare Other

## 2016-08-26 DIAGNOSIS — Z8701 Personal history of pneumonia (recurrent): Secondary | ICD-10-CM

## 2016-09-24 ENCOUNTER — Other Ambulatory Visit: Payer: Self-pay | Admitting: Nurse Practitioner

## 2016-09-24 ENCOUNTER — Other Ambulatory Visit: Payer: Self-pay | Admitting: Family Medicine

## 2016-10-26 ENCOUNTER — Other Ambulatory Visit: Payer: Self-pay | Admitting: Family Medicine

## 2016-11-25 ENCOUNTER — Encounter: Payer: Self-pay | Admitting: *Deleted

## 2017-01-21 ENCOUNTER — Other Ambulatory Visit: Payer: Self-pay | Admitting: Family Medicine

## 2017-01-21 ENCOUNTER — Other Ambulatory Visit: Payer: Self-pay | Admitting: Nurse Practitioner

## 2017-01-21 NOTE — Telephone Encounter (Signed)
Appt 01/28/17

## 2017-01-24 ENCOUNTER — Other Ambulatory Visit: Payer: Self-pay | Admitting: Family Medicine

## 2017-01-27 ENCOUNTER — Ambulatory Visit: Payer: Medicare Other | Admitting: Family Medicine

## 2017-01-28 ENCOUNTER — Ambulatory Visit (INDEPENDENT_AMBULATORY_CARE_PROVIDER_SITE_OTHER): Payer: Medicare Other | Admitting: Family Medicine

## 2017-01-28 ENCOUNTER — Encounter: Payer: Self-pay | Admitting: Family Medicine

## 2017-01-28 VITALS — BP 128/66 | HR 68 | Temp 97.7°F | Ht 67.0 in | Wt 151.0 lb

## 2017-01-28 DIAGNOSIS — E785 Hyperlipidemia, unspecified: Secondary | ICD-10-CM | POA: Diagnosis not present

## 2017-01-28 DIAGNOSIS — I10 Essential (primary) hypertension: Secondary | ICD-10-CM

## 2017-01-28 DIAGNOSIS — K219 Gastro-esophageal reflux disease without esophagitis: Secondary | ICD-10-CM

## 2017-01-28 DIAGNOSIS — J439 Emphysema, unspecified: Secondary | ICD-10-CM

## 2017-01-28 NOTE — Progress Notes (Signed)
BP 128/66   Pulse 68   Temp 97.7 F (36.5 C) (Oral)   Ht _0  (1.702 m)   Wt 151 lb (68.5 kg)   BMI 23.65 kg/m    Subjective:    Patient ID: Alex [name], male    DOB: July 24, 1932, 83 y.o.   MRN: [id number]  HPI: Alex [name] is a 83 y.o. male presenting on 01/28/2017 for Hypertension (6 mo; patient is fasting); Hyperlipidemia; Gastroesophageal Reflux; and Shortness of Breath (increased recently, worse with activity)   HPI Hypertension Patient is currently on Metoprolol and amlodipine, and their blood pressure today is 128/66. Patient denies any lightheadedness or dizziness. Patient denies headaches, blurred vision, chest pains, shortness of breath, or weakness. Denies any side effects from medication and is content with current medication.   Hyperlipidemia Patient is coming in for recheck of his hyperlipidemia. The patient is currently taking simvastatin. They deny any issues with myalgias or history of liver damage from it. They deny any focal numbness or weakness or chest pain.   COPD recheck Patient is currently using anoro for his COPD. He does admit that he still has coughing and wheezing but no major spells or episodes recently. He says it is relatively controlled and is doing well on the inhaler that he has currently. He does have some shortness of breath with activity that is slightly increased recently but he does say that he has not been using his inhaler every day like he was previously when he was on his inhaler and have the prescription he was doing well. He will go back to using his inhaler every day.  GERD recheck Patient is currently on Nexium for his GERD and says it is controlled. He denies any bleeding episodes or abdominal pain. He denies any blood in the stool.  Relevant past medical, surgical, family and social history reviewed and updated as indicated. Interim medical history since our last visit reviewed. Allergies and medications reviewed and  updated.  Review of Systems  Constitutional: Negative for chills and fever.  HENT: Negative for congestion and ear pain.   Respiratory: Positive for cough and shortness of breath (On exertion). Negative for wheezing.   Cardiovascular: Negative for chest pain and leg swelling.  Gastrointestinal: Negative for abdominal pain, blood in stool, constipation, diarrhea, nausea and vomiting.  Musculoskeletal: Negative for back pain and gait problem.  Skin: Negative for rash.  Neurological: Negative for dizziness, weakness, light-headedness, numbness and headaches.  All other systems reviewed and are negative.   Per HPI unless specifically indicated above     Objective:    BP 128/66   Pulse 68   Temp 97.7 F (36.5 C) (Oral)   Ht _1  (1.702 m)   Wt 151 lb (68.5 kg)   BMI 23.65 kg/m   Wt Readings from Last 3 Encounters:  01/28/17 151 lb (68.5 kg)  07/29/16 142 lb 12.8 oz (64.8 kg)  07/24/16 144 lb (65.3 kg)    Physical Exam  Constitutional: He is oriented to person, place, and time. He appears well-developed and well-nourished. No distress.  Eyes: Conjunctivae are normal. No scleral icterus.  Neck: Neck supple. No thyromegaly present.  Cardiovascular: Normal rate, regular rhythm, normal heart sounds and intact distal pulses.   No murmur heard. Pulmonary/Chest: Effort normal and breath sounds normal. No respiratory distress. He has no wheezes. He has no rales.  Abdominal: Soft. Bowel sounds are normal. He exhibits no distension. There is no tenderness. There is no rebound.  Musculoskeletal: Normal range of motion. He exhibits no edema.  Lymphadenopathy:    He has no cervical adenopathy.  Neurological: He is alert and oriented to person, place, and time. Coordination normal.  Skin: Skin is warm and dry. No rash noted. He is not diaphoretic.  Psychiatric: He has a normal mood and affect. His behavior is normal.  Nursing note and vitals reviewed.       Assessment & Plan:    Problem List Items Addressed This Visit      Cardiovascular and Mediastinum   Hypertension   Relevant Orders   CMP14+EGFR (Completed)     Respiratory   Pulmonary emphysema ([place]) - Primary   Relevant Orders   CBC with Differential/Platelet (Completed)     Digestive   GERD (gastroesophageal reflux disease)   Relevant Orders   CBC with Differential/Platelet (Completed)     Other   Hyperlipidemia with target LDL less than 100   Relevant Orders   Lipid panel (Completed)       Follow up plan: Return in about 6 months (around 07/31/2017), or if symptoms worsen or fail to improve, for Hypertension and cholesterol recheck.  Counseling provided for all of the vaccine components Orders Placed This Encounter  Procedures  . CMP14+EGFR  . Lipid panel  . CBC with Differential/Platelet    Caryl Pina, MD [facility] 01/28/2017, 11:00 AM

## 2017-01-29 LAB — CBC WITH DIFFERENTIAL/PLATELET
BASOS: 1 %
Basophils Absolute: 0.1 10*3/uL (ref 0.0–0.2)
EOS (ABSOLUTE): 0.4 10*3/uL (ref 0.0–0.4)
EOS: 5 %
HEMATOCRIT: 38.7 % (ref 37.5–51.0)
HEMOGLOBIN: 12.5 g/dL — AB (ref 13.0–17.7)
Immature Grans (Abs): 0 10*3/uL (ref 0.0–0.1)
Immature Granulocytes: 0 %
LYMPHS ABS: 2.3 10*3/uL (ref 0.7–3.1)
Lymphs: 29 %
MCH: 30.6 pg (ref 26.6–33.0)
MCHC: 32.3 g/dL (ref 31.5–35.7)
MCV: 95 fL (ref 79–97)
MONOCYTES: 11 %
Monocytes Absolute: 0.8 10*3/uL (ref 0.1–0.9)
NEUTROS ABS: 4.2 10*3/uL (ref 1.4–7.0)
Neutrophils: 54 %
Platelets: 213 10*3/uL (ref 150–379)
RBC: 4.08 x10E6/uL — ABNORMAL LOW (ref 4.14–5.80)
RDW: 13.5 % (ref 12.3–15.4)
WBC: 7.8 10*3/uL (ref 3.4–10.8)

## 2017-01-29 LAB — CMP14+EGFR
A/G RATIO: 1.5 (ref 1.2–2.2)
ALK PHOS: 90 IU/L (ref 39–117)
ALT: 8 IU/L (ref 0–44)
AST: 20 IU/L (ref 0–40)
Albumin: 4.2 g/dL (ref 3.5–4.7)
BUN/Creatinine Ratio: 7 — ABNORMAL LOW (ref 10–24)
BUN: 8 mg/dL (ref 8–27)
Bilirubin Total: 0.4 mg/dL (ref 0.0–1.2)
CALCIUM: 9.3 mg/dL (ref 8.6–10.2)
CHLORIDE: 96 mmol/L (ref 96–106)
CO2: 28 mmol/L (ref 20–29)
Creatinine, Ser: 1.17 mg/dL (ref 0.76–1.27)
GFR calc Af Amer: 67 mL/min/{1.73_m2} (ref >59)
GFR, EST NON AFRICAN AMERICAN: 58 mL/min/{1.73_m2} — AB (ref >59)
Globulin, Total: 2.8 g/dL (ref 1.5–4.5)
Glucose: 94 mg/dL (ref 65–99)
POTASSIUM: 4.9 mmol/L (ref 3.5–5.2)
Sodium: 137 mmol/L (ref 134–144)
Total Protein: 7 g/dL (ref 6.0–8.5)

## 2017-01-29 LAB — LIPID PANEL
CHOL/HDL RATIO: 2.8 ratio (ref 0.0–5.0)
CHOLESTEROL TOTAL: 139 mg/dL (ref 100–199)
HDL: 49 mg/dL (ref >39)
LDL Calculated: 53 mg/dL (ref 0–99)
TRIGLYCERIDES: 183 mg/dL — AB (ref 0–149)
VLDL Cholesterol Cal: 37 mg/dL (ref 5–40)

## 2017-03-15 ENCOUNTER — Ambulatory Visit (INDEPENDENT_AMBULATORY_CARE_PROVIDER_SITE_OTHER): Payer: Medicare Other

## 2017-03-15 DIAGNOSIS — Z23 Encounter for immunization: Secondary | ICD-10-CM | POA: Diagnosis not present

## 2017-04-28 ENCOUNTER — Other Ambulatory Visit: Payer: Self-pay | Admitting: Family Medicine

## 2017-06-04 ENCOUNTER — Encounter: Payer: Self-pay | Admitting: Family Medicine

## 2017-06-04 ENCOUNTER — Ambulatory Visit (INDEPENDENT_AMBULATORY_CARE_PROVIDER_SITE_OTHER): Payer: Medicare Other | Admitting: Family Medicine

## 2017-06-04 VITALS — BP 121/61 | HR 77 | Temp 98.6°F | Ht 67.0 in | Wt 150.4 lb

## 2017-06-04 DIAGNOSIS — S2231XA Fracture of one rib, right side, initial encounter for closed fracture: Secondary | ICD-10-CM | POA: Diagnosis not present

## 2017-06-04 MED ORDER — BACLOFEN 10 MG PO TABS: 10.0000 mg | ORAL_TABLET | Freq: Three times a day (TID) | ORAL | 0 refills | Status: DC

## 2017-06-04 MED ORDER — HYDROCODONE-ACETAMINOPHEN 5-325 MG PO TABS: 1.0000 | ORAL_TABLET | Freq: Four times a day (QID) | ORAL | 0 refills | Status: DC | PRN

## 2017-06-04 NOTE — Progress Notes (Signed)
BP 121/61   Pulse 77   Temp 98.6 F (37 C) (Oral)   Ht 5\' 7"  (1.702 m)   Wt 150 lb 6.4 oz (68.2 kg)   BMI 23.56 kg/m    Subjective:    Patient ID: Alex [name], male    DOB: 1932/07/22, 83 y.o.   MRN: [id number]  HPI: Alex [name] is a 83 y.o. male presenting on 06/04/2017 for Back Pain   HPI Right back/lower rib pain Patient is having right back/lower rib pain that started 1 week ago.  He says he was lifting some things and pushing and felt something pop in that region.  Over the past week he has been trying to take anti-inflammatories and Tylenol to help with it but they do not seem to be helping and it does not seem to be improving.  His wife finally convinced him to come in to get it checked out.  He says that the pain is worse with twisting of his torso and with deep inspiration in that back.  Sometimes it does radiate around to the front of his right ribs as well.  He denies any cough or wheezing or shortness of breath.  He says the only time he feels tight or pain is when he takes a deep breath or coughs or sneezes.  Relevant past medical, surgical, family and social history reviewed and updated as indicated. Interim medical history since our last visit reviewed. Allergies and medications reviewed and updated.  Review of Systems  Constitutional: Negative for chills and fever.  Respiratory: Negative for shortness of breath and wheezing.   Cardiovascular: Negative for chest pain and leg swelling.  Musculoskeletal: Positive for back pain. Negative for gait problem.  Skin: Negative for rash.  All other systems reviewed and are negative.   Per HPI unless specifically indicated above        Objective:    BP 121/61   Pulse 77   Temp 98.6 F (37 C) (Oral)   Ht 5\' 7"  (1.702 m)   Wt 150 lb 6.4 oz (68.2 kg)   BMI 23.56 kg/m   Wt Readings from Last 3 Encounters:  06/04/17 150 lb 6.4 oz (68.2 kg)  01/28/17 151 lb (68.5 kg)  07/29/16 142 lb 12.8 oz (64.8 kg)      Physical Exam  Constitutional: He is oriented to person, place, and time. He appears well-developed and well-nourished. No distress.  Eyes: Conjunctivae are normal. No scleral icterus.  Cardiovascular: Normal rate, regular rhythm, normal heart sounds and intact distal pulses.  No murmur heard. Pulmonary/Chest: Effort normal and breath sounds normal. No respiratory distress. He has no wheezes. He has no rales. He exhibits tenderness (Right lower posterior rib tenderness with positive compression test, likely rib fracture).  Musculoskeletal: Normal range of motion. He exhibits no edema.  Neurological: He is alert and oriented to person, place, and time. Coordination normal.  Skin: Skin is warm and dry. No rash noted. He is not diaphoretic.  Psychiatric: He has a normal mood and affect. His behavior is normal.  Nursing note and vitals reviewed.       Assessment & Plan:   Problem List Items Addressed This Visit    None    Visit Diagnoses    Closed fracture of one rib of right side, initial encounter    -  Primary   Likely nondisplaced rib fracture, pain with deep breathing versus muscular pain around the rib   Relevant Medications   HYDROcodone-acetaminophen (  NORCO/VICODIN) 5-325 MG tablet     No flail chest or displacement noticed  Follow up plan: Return if symptoms worsen or fail to improve.  Counseling provided for all of the vaccine components No orders of the defined types were placed in this encounter.   Caryl Pina, MD [facility] 06/04/2017, 6:09 PM

## 2017-07-28 ENCOUNTER — Other Ambulatory Visit: Payer: Self-pay | Admitting: Family Medicine

## 2017-07-29 NOTE — Telephone Encounter (Signed)
OV 2/18//19

## 2017-08-02 ENCOUNTER — Ambulatory Visit (INDEPENDENT_AMBULATORY_CARE_PROVIDER_SITE_OTHER): Payer: Medicare Other | Admitting: Family Medicine

## 2017-08-02 ENCOUNTER — Encounter: Payer: Self-pay | Admitting: Family Medicine

## 2017-08-02 VITALS — BP 137/75 | HR 68 | Temp 97.7°F | Ht 67.0 in | Wt 148.0 lb

## 2017-08-02 DIAGNOSIS — E785 Hyperlipidemia, unspecified: Secondary | ICD-10-CM

## 2017-08-02 DIAGNOSIS — I10 Essential (primary) hypertension: Secondary | ICD-10-CM

## 2017-08-02 DIAGNOSIS — K219 Gastro-esophageal reflux disease without esophagitis: Secondary | ICD-10-CM | POA: Diagnosis not present

## 2017-08-02 NOTE — Progress Notes (Signed)
 BP 137/75   Pulse 68   Temp 97.7 F (36.5 C) (Oral)   Ht 5' 7" (1.702 m)   Wt 148 lb (67.1 kg)   BMI 23.18 kg/m    Subjective:    Patient ID: Alex [name], male    DOB: 09/13/1932, 85 y.o.   MRN: [id number]  HPI: Alex [name] is a 85 y.o. male presenting on 08/02/2017 for Hypertension (6 mo) and Hyperlipidemia   HPI Hypertension Patient is currently on amlodipine and metoprolol, and their blood pressure today is 137/75. Patient denies any lightheadedness or dizziness. Patient denies headaches, blurred vision, chest pains, shortness of breath, or weakness. Denies any side effects from medication and is content with current medication.   Hyperlipidemia Patient is coming in for recheck of his hyperlipidemia. The patient is currently taking simvastatin. They deny any issues with myalgias or history of liver damage from it. They deny any focal numbness or weakness or chest pain.   GERD Patient is currently on Nexium.  She denies any major symptoms or abdominal pain or belching or burping. She denies any blood in her stool or lightheadedness or dizziness.   Relevant past medical, surgical, family and social history reviewed and updated as indicated. Interim medical history since our last visit reviewed. Allergies and medications reviewed and updated.  Review of Systems  Constitutional: Negative for chills and fever.  Eyes: Negative for discharge.  Respiratory: Negative for shortness of breath and wheezing.   Cardiovascular: Negative for chest pain and leg swelling.  Gastrointestinal: Negative for abdominal pain.  Musculoskeletal: Negative for back pain and gait problem.  Skin: Negative for rash.  Neurological: Negative for dizziness, weakness, light-headedness and headaches.  All other systems reviewed and are negative.   Per HPI unless specifically indicated above   Allergies as of 08/02/2017   No Known Allergies     Medication List        Accurate as of 08/02/17  10:33 AM. Always use your most recent med list.          amLODipine 5 MG tablet Commonly known as:  NORVASC TAKE ONE (1) TABLET EACH DAY   aspirin 81 MG tablet Take 81 mg by mouth daily.   CENTRUM SILVER ADULT 50+ PO Take by mouth daily.   cetirizine 10 MG tablet Commonly known as:  ZYRTEC TAKE ONE (1) TABLET EACH DAY   esomeprazole 40 MG capsule Commonly known as:  NEXIUM TAKE ONE (1) CAPSULE EACH DAY   fluticasone 50 MCG/ACT nasal spray Commonly known as:  FLONASE Place 2 sprays into both nostrils daily.   memantine 10 MG tablet Commonly known as:  NAMENDA TAKE ONE TABLET BY MOUTH TWICE DAILY   metoprolol tartrate 50 MG tablet Commonly known as:  LOPRESSOR TAKE ONE TABLET BY MOUTH TWICE DAILY   PROAIR HFA 108 (90 Base) MCG/ACT inhaler Generic drug:  albuterol USE 2 PUFFS 4 TIMES DAILY   simvastatin 20 MG tablet Commonly known as:  ZOCOR TAKE ONE TABLET DAILY AT BEDTIME   umeclidinium-vilanterol 62.5-25 MCG/INH Aepb Commonly known as:  ANORO ELLIPTA One puff daily          Objective:    BP 137/75   Pulse 68   Temp 97.7 F (36.5 C) (Oral)   Ht 5' 7" (1.702 m)   Wt 148 lb (67.1 kg)   BMI 23.18 kg/m   Wt Readings from Last 3 Encounters:  08/02/17 148 lb (67.1 kg)  06/04/17 150 lb 6.4 oz (68.2   kg)  01/28/17 151 lb (68.5 kg)    Physical Exam  Constitutional: He is oriented to person, place, and time. He appears well-developed and well-nourished. No distress.  Eyes: Conjunctivae are normal. No scleral icterus.  Neck: Neck supple. No thyromegaly present.  Cardiovascular: Normal rate, regular rhythm, normal heart sounds and intact distal pulses.  No murmur heard. Pulmonary/Chest: Effort normal and breath sounds normal. No respiratory distress. He has no wheezes. He has no rales.  Musculoskeletal: Normal range of motion. He exhibits no edema.  Lymphadenopathy:    He has no cervical adenopathy.  Neurological: He is alert and oriented to person,  place, and time. Coordination normal.  Skin: Skin is warm and dry. No rash noted. He is not diaphoretic.  Psychiatric: He has a normal mood and affect. His behavior is normal.  Nursing note and vitals reviewed.       Assessment & Plan:   Problem List Items Addressed This Visit      Cardiovascular and Mediastinum   Hypertension   Relevant Orders   CMP14+EGFR     Digestive   GERD (gastroesophageal reflux disease) - Primary     Other   Hyperlipidemia with target LDL less than 100   Relevant Orders   Lipid panel       Follow up plan: Return in about 6 months (around 01/30/2018), or if symptoms worsen or fail to improve, for Hypertension and cholesterol recheck.  Counseling provided for all of the vaccine components No orders of the defined types were placed in this encounter.   Caryl Pina, MD [facility] 08/02/2017, 10:33 AM

## 2017-08-03 LAB — CMP14+EGFR
A/G RATIO: 1.3 (ref 1.2–2.2)
ALBUMIN: 4 g/dL (ref 3.5–4.7)
ALK PHOS: 92 IU/L (ref 39–117)
ALT: 13 IU/L (ref 0–44)
AST: 16 IU/L (ref 0–40)
BILIRUBIN TOTAL: 0.4 mg/dL (ref 0.0–1.2)
BUN / CREAT RATIO: 8 — AB (ref 10–24)
BUN: 8 mg/dL (ref 8–27)
CO2: 25 mmol/L (ref 20–29)
CREATININE: 1.01 mg/dL (ref 0.76–1.27)
Calcium: 9 mg/dL (ref 8.6–10.2)
Chloride: 98 mmol/L (ref 96–106)
GFR calc Af Amer: 79 mL/min/{1.73_m2} (ref >59)
GFR calc non Af Amer: 68 mL/min/{1.73_m2} (ref >59)
GLOBULIN, TOTAL: 3.1 g/dL (ref 1.5–4.5)
Glucose: 98 mg/dL (ref 65–99)
POTASSIUM: 4.3 mmol/L (ref 3.5–5.2)
SODIUM: 139 mmol/L (ref 134–144)
Total Protein: 7.1 g/dL (ref 6.0–8.5)

## 2017-08-03 LAB — LIPID PANEL
CHOL/HDL RATIO: 2.5 ratio (ref 0.0–5.0)
Cholesterol, Total: 127 mg/dL (ref 100–199)
HDL: 51 mg/dL (ref >39)
LDL CALC: 54 mg/dL (ref 0–99)
Triglycerides: 111 mg/dL (ref 0–149)
VLDL Cholesterol Cal: 22 mg/dL (ref 5–40)

## 2017-08-20 ENCOUNTER — Emergency Department (HOSPITAL_COMMUNITY): Payer: Medicare Other

## 2017-08-20 ENCOUNTER — Inpatient Hospital Stay (HOSPITAL_COMMUNITY)
Admission: EM | Admit: 2017-08-20 | Discharge: 2017-08-24 | DRG: 064 | Disposition: A | Discharge status: Hospice | Payer: Medicare Other | Attending: Neurology | Admitting: Neurology

## 2017-08-20 ENCOUNTER — Inpatient Hospital Stay (HOSPITAL_COMMUNITY): Payer: Medicare Other

## 2017-08-20 ENCOUNTER — Encounter (HOSPITAL_COMMUNITY): Payer: Self-pay

## 2017-08-20 DIAGNOSIS — S066X0A Traumatic subarachnoid hemorrhage without loss of consciousness, initial encounter: Secondary | ICD-10-CM | POA: Diagnosis not present

## 2017-08-20 DIAGNOSIS — G8194 Hemiplegia, unspecified affecting left nondominant side: Secondary | ICD-10-CM | POA: Diagnosis present

## 2017-08-20 DIAGNOSIS — G936 Cerebral edema: Secondary | ICD-10-CM | POA: Diagnosis not present

## 2017-08-20 DIAGNOSIS — J690 Pneumonitis due to inhalation of food and vomit: Secondary | ICD-10-CM | POA: Diagnosis not present

## 2017-08-20 DIAGNOSIS — Z8673 Personal history of transient ischemic attack (TIA), and cerebral infarction without residual deficits: Secondary | ICD-10-CM

## 2017-08-20 DIAGNOSIS — Z8249 Family history of ischemic heart disease and other diseases of the circulatory system: Secondary | ICD-10-CM

## 2017-08-20 DIAGNOSIS — Z825 Family history of asthma and other chronic lower respiratory diseases: Secondary | ICD-10-CM

## 2017-08-20 DIAGNOSIS — Z66 Do not resuscitate: Secondary | ICD-10-CM | POA: Diagnosis present

## 2017-08-20 DIAGNOSIS — I611 Nontraumatic intracerebral hemorrhage in hemisphere, cortical: Secondary | ICD-10-CM | POA: Diagnosis not present

## 2017-08-20 DIAGNOSIS — Z87891 Personal history of nicotine dependence: Secondary | ICD-10-CM | POA: Diagnosis not present

## 2017-08-20 DIAGNOSIS — G9349 Other encephalopathy: Secondary | ICD-10-CM | POA: Diagnosis not present

## 2017-08-20 DIAGNOSIS — Z902 Acquired absence of lung [part of]: Secondary | ICD-10-CM

## 2017-08-20 DIAGNOSIS — T17908A Unspecified foreign body in respiratory tract, part unspecified causing other injury, initial encounter: Secondary | ICD-10-CM

## 2017-08-20 DIAGNOSIS — R062 Wheezing: Secondary | ICD-10-CM | POA: Diagnosis not present

## 2017-08-20 DIAGNOSIS — J449 Chronic obstructive pulmonary disease, unspecified: Secondary | ICD-10-CM | POA: Diagnosis not present

## 2017-08-20 DIAGNOSIS — G911 Obstructive hydrocephalus: Secondary | ICD-10-CM | POA: Diagnosis present

## 2017-08-20 DIAGNOSIS — I629 Nontraumatic intracranial hemorrhage, unspecified: Secondary | ICD-10-CM

## 2017-08-20 DIAGNOSIS — R000 Tachycardia, unspecified: Secondary | ICD-10-CM | POA: Diagnosis present

## 2017-08-20 DIAGNOSIS — I639 Cerebral infarction, unspecified: Secondary | ICD-10-CM

## 2017-08-20 DIAGNOSIS — Z803 Family history of malignant neoplasm of breast: Secondary | ICD-10-CM

## 2017-08-20 DIAGNOSIS — I161 Hypertensive emergency: Secondary | ICD-10-CM

## 2017-08-20 DIAGNOSIS — F0390 Unspecified dementia without behavioral disturbance: Secondary | ICD-10-CM | POA: Diagnosis present

## 2017-08-20 DIAGNOSIS — R1312 Dysphagia, oropharyngeal phase: Secondary | ICD-10-CM | POA: Diagnosis not present

## 2017-08-20 DIAGNOSIS — Z452 Encounter for adjustment and management of vascular access device: Secondary | ICD-10-CM | POA: Diagnosis not present

## 2017-08-20 DIAGNOSIS — G935 Compression of brain: Secondary | ICD-10-CM | POA: Diagnosis not present

## 2017-08-20 DIAGNOSIS — Z7982 Long term (current) use of aspirin: Secondary | ICD-10-CM | POA: Diagnosis not present

## 2017-08-20 DIAGNOSIS — R1310 Dysphagia, unspecified: Secondary | ICD-10-CM

## 2017-08-20 DIAGNOSIS — I10 Essential (primary) hypertension: Secondary | ICD-10-CM

## 2017-08-20 DIAGNOSIS — D509 Iron deficiency anemia, unspecified: Secondary | ICD-10-CM | POA: Diagnosis present

## 2017-08-20 DIAGNOSIS — Z85118 Personal history of other malignant neoplasm of bronchus and lung: Secondary | ICD-10-CM

## 2017-08-20 DIAGNOSIS — I509 Heart failure, unspecified: Secondary | ICD-10-CM | POA: Diagnosis not present

## 2017-08-20 DIAGNOSIS — I615 Nontraumatic intracerebral hemorrhage, intraventricular: Secondary | ICD-10-CM | POA: Diagnosis not present

## 2017-08-20 DIAGNOSIS — I619 Nontraumatic intracerebral hemorrhage, unspecified: Secondary | ICD-10-CM | POA: Diagnosis not present

## 2017-08-20 DIAGNOSIS — K219 Gastro-esophageal reflux disease without esophagitis: Secondary | ICD-10-CM | POA: Diagnosis present

## 2017-08-20 DIAGNOSIS — E785 Hyperlipidemia, unspecified: Secondary | ICD-10-CM | POA: Diagnosis present

## 2017-08-20 DIAGNOSIS — R918 Other nonspecific abnormal finding of lung field: Secondary | ICD-10-CM | POA: Diagnosis not present

## 2017-08-20 DIAGNOSIS — R471 Dysarthria and anarthria: Secondary | ICD-10-CM | POA: Diagnosis not present

## 2017-08-20 DIAGNOSIS — J439 Emphysema, unspecified: Secondary | ICD-10-CM | POA: Diagnosis not present

## 2017-08-20 DIAGNOSIS — R0603 Acute respiratory distress: Secondary | ICD-10-CM | POA: Diagnosis not present

## 2017-08-20 LAB — BLOOD GAS, ARTERIAL
ACID-BASE EXCESS: 3.8 mmol/L — AB (ref 0.0–2.0)
BICARBONATE: 27.4 mmol/L (ref 20.0–28.0)
Drawn by: 27733
FIO2: 0.21
O2 SAT: 95.6 %
PCO2 ART: 46.9 mmHg (ref 32.0–48.0)
PH ART: 7.398 (ref 7.350–7.450)
Patient temperature: 37
pO2, Arterial: 82.7 mmHg — ABNORMAL LOW (ref 83.0–108.0)

## 2017-08-20 LAB — I-STAT CG4 LACTIC ACID, ED: LACTIC ACID, VENOUS: 1.06 mmol/L (ref 0.5–1.9)

## 2017-08-20 LAB — COMPREHENSIVE METABOLIC PANEL
ALK PHOS: 84 U/L (ref 38–126)
ALT: 12 U/L — AB (ref 17–63)
AST: 22 U/L (ref 15–41)
Albumin: 3.9 g/dL (ref 3.5–5.0)
Anion gap: 10 (ref 5–15)
BILIRUBIN TOTAL: 0.8 mg/dL (ref 0.3–1.2)
BUN: 14 mg/dL (ref 6–20)
CALCIUM: 9.2 mg/dL (ref 8.9–10.3)
CO2: 29 mmol/L (ref 22–32)
CREATININE: 1.08 mg/dL (ref 0.61–1.24)
Chloride: 101 mmol/L (ref 101–111)
Glucose, Bld: 156 mg/dL — ABNORMAL HIGH (ref 65–99)
Potassium: 4.2 mmol/L (ref 3.5–5.1)
Sodium: 140 mmol/L (ref 135–145)
Total Protein: 7.4 g/dL (ref 6.5–8.1)

## 2017-08-20 LAB — CBC WITH DIFFERENTIAL/PLATELET
BASOS PCT: 0 %
Basophils Absolute: 0 10*3/uL (ref 0.0–0.1)
EOS ABS: 0.1 10*3/uL (ref 0.0–0.7)
EOS PCT: 1 %
HCT: 36.4 % — ABNORMAL LOW (ref 39.0–52.0)
Hemoglobin: 11.3 g/dL — ABNORMAL LOW (ref 13.0–17.0)
LYMPHS ABS: 1 10*3/uL (ref 0.7–4.0)
Lymphocytes Relative: 8 %
MCH: 29.8 pg (ref 26.0–34.0)
MCHC: 31 g/dL (ref 30.0–36.0)
MCV: 96 fL (ref 78.0–100.0)
MONOS PCT: 5 %
Monocytes Absolute: 0.6 10*3/uL (ref 0.1–1.0)
NEUTROS PCT: 86 %
Neutro Abs: 10 10*3/uL — ABNORMAL HIGH (ref 1.7–7.7)
PLATELETS: 185 10*3/uL (ref 150–400)
RBC: 3.79 MIL/uL — AB (ref 4.22–5.81)
RDW: 13.5 % (ref 11.5–15.5)
WBC: 11.7 10*3/uL — AB (ref 4.0–10.5)

## 2017-08-20 LAB — PROTIME-INR
INR: 1.01
Prothrombin Time: 13.2 seconds (ref 11.4–15.2)

## 2017-08-20 LAB — TROPONIN I: Troponin I: <0.03 ng/mL (ref <0.03)

## 2017-08-20 LAB — AMMONIA: Ammonia: 13 umol/L (ref 9–35)

## 2017-08-20 LAB — ETHANOL: Alcohol, Ethyl (B): <10 mg/dL (ref <10)

## 2017-08-20 LAB — CBG MONITORING, ED: Glucose-Capillary: 155 mg/dL — ABNORMAL HIGH (ref 65–99)

## 2017-08-20 LAB — SODIUM: Sodium: 134 mmol/L — ABNORMAL LOW (ref 135–145)

## 2017-08-20 MED ORDER — ACETAMINOPHEN 160 MG/5ML PO SOLN: 650.0000 mg | ORAL | Status: DC | PRN

## 2017-08-20 MED ORDER — NICARDIPINE HCL IN NACL 20-0.86 MG/200ML-% IV SOLN
3.0000 mg/h | Freq: Once | INTRAVENOUS | Status: AC
  Administered 2017-08-20: 5 mg/h via INTRAVENOUS
  Filled 2017-08-20: qty 200

## 2017-08-20 MED ORDER — ONDANSETRON 4 MG PO TBDP
4.0000 mg | ORAL_TABLET | Freq: Once | ORAL | Status: AC
  Administered 2017-08-20: 4 mg via ORAL
  Filled 2017-08-20: qty 1

## 2017-08-20 MED ORDER — SENNOSIDES-DOCUSATE SODIUM 8.6-50 MG PO TABS: 1.0000 | ORAL_TABLET | Freq: Two times a day (BID) | ORAL | Status: DC

## 2017-08-20 MED ORDER — ACETAMINOPHEN 650 MG RE SUPP
650.0000 mg | RECTAL | Status: DC | PRN
  Administered 2017-08-21 – 2017-08-23 (×6): 650 mg via RECTAL
  Filled 2017-08-20 (×6): qty 1

## 2017-08-20 MED ORDER — MEMANTINE HCL 10 MG PO TABS
10.0000 mg | ORAL_TABLET | Freq: Two times a day (BID) | ORAL | Status: DC
  Filled 2017-08-20 (×6): qty 1

## 2017-08-20 MED ORDER — IOPAMIDOL (ISOVUE-370) INJECTION 76%
80.0000 mL | Freq: Once | INTRAVENOUS | Status: AC | PRN
  Administered 2017-08-20: 18:00:00 via INTRAVENOUS

## 2017-08-20 MED ORDER — PANTOPRAZOLE SODIUM 40 MG IV SOLR
40.0000 mg | Freq: Every day | INTRAVENOUS | Status: DC
  Administered 2017-08-21 – 2017-08-22 (×2): 40 mg via INTRAVENOUS
  Filled 2017-08-20 (×2): qty 40

## 2017-08-20 MED ORDER — NICARDIPINE HCL IN NACL 20-0.86 MG/200ML-% IV SOLN
0.0000 mg/h | INTRAVENOUS | Status: DC
  Administered 2017-08-21 (×2): 5 mg/h via INTRAVENOUS
  Administered 2017-08-21: 3 mg/h via INTRAVENOUS
  Filled 2017-08-20 (×2): qty 200

## 2017-08-20 MED ORDER — STROKE: EARLY STAGES OF RECOVERY BOOK
Freq: Once | Status: AC
  Administered 2017-08-20: 1
  Filled 2017-08-20: qty 1

## 2017-08-20 MED ORDER — FLUTICASONE PROPIONATE 50 MCG/ACT NA SUSP
2.0000 | Freq: Every day | NASAL | Status: DC
  Filled 2017-08-20: qty 16

## 2017-08-20 MED ORDER — SODIUM CHLORIDE 3 % IV SOLN
INTRAVENOUS | Status: AC
  Administered 2017-08-20: 75 mL/h via INTRAVENOUS
  Administered 2017-08-21: 50 mL/h via INTRAVENOUS
  Administered 2017-08-21: 75 mL/h via INTRAVENOUS
  Filled 2017-08-20 (×4): qty 500

## 2017-08-20 MED ORDER — ONDANSETRON HCL 4 MG/2ML IJ SOLN: 4.0000 mg | Freq: Three times a day (TID) | INTRAMUSCULAR | Status: AC | PRN

## 2017-08-20 MED ORDER — NICARDIPINE HCL IN NACL 20-0.86 MG/200ML-% IV SOLN
3.0000 mg/h | Freq: Once | INTRAVENOUS | Status: AC
  Administered 2017-08-20: 3 mg/h via INTRAVENOUS

## 2017-08-20 MED ORDER — PANTOPRAZOLE SODIUM 40 MG PO TBEC: 40.0000 mg | DELAYED_RELEASE_TABLET | Freq: Every day | ORAL | Status: DC

## 2017-08-20 MED ORDER — ACETAMINOPHEN 325 MG PO TABS: 650.0000 mg | ORAL_TABLET | ORAL | Status: DC | PRN

## 2017-08-20 NOTE — H&P (Signed)
Stroke admission H&P CC: Altered mental status, vomiting  History is obtained from: Chart review  HPI: Alex [name] is a 84 y.o. male past medical history of alcohol abuse, dementia, COPD, lung cancer 2007, presented to the emergency room at [facility] for evaluation when he was found by his wife who lives separately and checks on him regularly, to be confused. He had not filled his pillbox with his normal pills according to the chart review.   His wife helps him with filling up his pillbox and trying to help him with ADLs but noticed that he appeared more and more confused.  As the day progressed, he became nauseated and had multiple episodes of vomiting.  The patient was unable to describe what the problems were or what he was feeling at that time.  There is no report of any drug overdose or alcohol abuse.  Unclear what the last known normal time is. Patient is unable to provide any coherent history.  No family at bedside at this time  ICH Score: 3 (for age, IVH and volume)   FXT:[id number] to obtain due to altered mental status.   Past Medical History:  Diagnosis Date  . Alcohol abuse    last drink 2003  . Allergy   . Asthma   . COPD, moderate ([place])   . Dementia   . Dementia   . Duodenitis   . Esophageal stricture   . GERD (gastroesophageal reflux disease)   . Herpes zoster   . Hiatal hernia   . Hypertension   . Inflammatory polyps of colon with rectal bleeding ([place])   . Lung cancer ([place])    rt upper lobe lobectomy 2007 radiation seed implants  . Post herpetic neuralgia   . Renal cyst, right   . Sinusitis   . Strep pharyngitis   . TIA (transient ischemic attack)     Family History  Problem Relation Age of Onset  . Cancer Mother        Breast Cancer  . Heart disease Mother 43       MI  . Aneurysm Father   . Heart disease Father   . Alcohol abuse Brother   . Heart disease Brother   . Cancer Sister        breast  . COPD Brother   . Colon cancer Neg Hx      Social History:   reports that he quit smoking about 15 years ago. His smoking use included cigarettes. he has never used smokeless tobacco. He reports that he does not drink alcohol or use drugs.  Medications  Current Facility-Administered Medications:  .   stroke: mapping our early stages of recovery book, , Does not apply, Once, Amie Portland, MD .  acetaminophen (TYLENOL) tablet 650 mg, 650 mg, Oral, Q4H PRN **OR** acetaminophen (TYLENOL) solution 650 mg, 650 mg, Per Tube, Q4H PRN **OR** acetaminophen (TYLENOL) suppository 650 mg, 650 mg, Rectal, Q4H PRN, Amie Portland, MD .  Derrill Memo ON 08/21/2017] fluticasone (FLONASE) 50 MCG/ACT nasal spray 2 spray, 2 spray, Each Nare, Daily, Amie Portland, MD .  memantine [facility]) tablet 10 mg, 10 mg, Oral, BID, Amie Portland, MD .  nicardipine (CARDENE) '20mg'$  in 0.86% saline 299m IV infusion (0.1 mg/ml), 0-15 mg/hr, Intravenous, Continuous, AAmie Portland MD .  ondansetron ([facility] injection 4 mg, 4 mg, Intravenous, Q8H PRN, MNoemi Chapel MD .  [Derrill MemoON 08/21/2017] pantoprazole (PROTONIX) EC tablet 40 mg, 40 mg, Oral, Daily, AAmie Portland MD .  pantoprazole ([place]  injection 40 mg, 40 mg, Intravenous, QHS, Amie Portland, MD .  senna-docusate (Senokot-S) tablet 1 tablet, 1 tablet, Oral, BID, Amie Portland, MD  Exam: Current vital signs: BP 129/70   Pulse (!) 105   Temp 98.6 F (37 C) (Oral)   Resp (!) 26   Wt 67.1 kg (148 lb)   SpO2 96%   BMI 23.18 kg/m   Vital signs in last 24 hours: Temp:  [97.5 F (36.4 C)-98.6 F (37 C)] 98.6 F (37 C) (03/08 1954) Pulse Rate:  [55-106] 105 (03/08 2045) Resp:  [19-28] 26 (03/08 2045) BP: (129-182)/(65-120) 129/70 (03/08 2045) SpO2:  [92 %-99 %] 96 % (03/08 2045) Weight:  [67.1 kg (148 lb)] 67.1 kg (148 lb) (03/08 1605) General: Patient is drowsy, wakes to voice, in no acute distress HEENT: Normocephalic, atraumatic, dry oral mucous membranes Cardiovascular: S1-S2 heard regular rate  rhythm Lungs: Scattered rales Extremities: Warm well perfused Abdomen: Nondistended nontender Neurological exam Patient is drowsy, wakes to voice His speech is very dysarthric He follows some commands Poor attention concentration Cranial nerves: Pupils equal round reactive to light, has a rightward gaze preference but not forced gaze.  Unable to cross midline to look towards left.  Does not blink to threat from either side.  Left lower facial weakness present.  Auditory acuity reduced bilaterally Motor exam: No spontaneous movement on the left.  No withdrawal to noxious stimulus on the left.  Is able to go antigravity with the right arm.  Is able to wiggle toes on the right leg to command. Sensory exam: Severe decreased sensation to noxious stimulus on the left.  Intact on the right Cannot test coordination and gait due to patient's altered mentation. NIHSS 1a Level of Conscious.: 1 1b LOC Questions: 2 1c LOC Commands: 1 2 Best Gaze: 1 3 Visual: 2 4 Facial Palsy: 1 5a Motor Arm - left: 4 5b Motor Arm - Right: 0 6a Motor Leg - Left: 3 6b Motor Leg - Right: 2 7 Limb Ataxia: 0 8 Sensory: 2 9 Best Language: 2 10 Dysarthria: 2 11 Extinct. and Inatten.: 0 TOTAL: 23  Labs I have reviewed labs in epic and the results pertinent to this consultation are: Leukocytosis-11.7 Anemia hemoglobin 11.3.  Near baseline. Glucose 156, ALT 12, rest of the BMP normal  CBC    Component Value Date/Time   WBC 11.7 (H) 08/20/2017 1633   RBC 3.79 (L) 08/20/2017 1633   HGB 11.3 (L) 08/20/2017 1633   HGB 12.5 (L) 01/28/2017 1103   HCT 36.4 (L) 08/20/2017 1633   HCT 38.7 01/28/2017 1103   PLT 185 08/20/2017 1633   PLT 213 01/28/2017 1103   MCV 96.0 08/20/2017 1633   MCV 95 01/28/2017 1103   MCH 29.8 08/20/2017 1633   MCHC 31.0 08/20/2017 1633   RDW 13.5 08/20/2017 1633   RDW 13.5 01/28/2017 1103   LYMPHSABS 1.0 08/20/2017 1633   LYMPHSABS 2.3 01/28/2017 1103   MONOABS 0.6 08/20/2017 1633    EOSABS 0.1 08/20/2017 1633   EOSABS 0.4 01/28/2017 1103   BASOSABS 0.0 08/20/2017 1633   BASOSABS 0.1 01/28/2017 1103    CMP     Component Value Date/Time   NA 140 08/20/2017 1633   NA 139 08/02/2017 1041   K 4.2 08/20/2017 1633   CL 101 08/20/2017 1633   CO2 29 08/20/2017 1633   GLUCOSE 156 (H) 08/20/2017 1633   BUN 14 08/20/2017 1633   BUN 8 08/02/2017 1041   CREATININE 1.08 08/20/2017  1633   CALCIUM 9.2 08/20/2017 1633   PROT 7.4 08/20/2017 1633   PROT 7.1 08/02/2017 1041   ALBUMIN 3.9 08/20/2017 1633   ALBUMIN 4.0 08/02/2017 1041   AST 22 08/20/2017 1633   ALT 12 (L) 08/20/2017 1633   ALKPHOS 84 08/20/2017 1633   BILITOT 0.8 08/20/2017 1633   BILITOT 0.4 08/02/2017 1041   GFRNONAA >60 08/20/2017 1633   GFRAA >60 08/20/2017 1633   Imaging I have reviewed the images obtained:  CT-scan of the brain -done at [facility] around 5 PM shows an acute right frontal hematoma approximately 97 cc in volume.  Small volume IVH pulling in the occipital horns.  Some subarachnoid over the right frontal convexity.  Mass-effect with right-to-left subfalcine herniation and 6 mm midline shift.   Assessment:  84 year old man with past history of alcohol abuse dementia COPD lung cancer in 2007 in remission presented to [facility] for evaluation of altered mental status and vomiting. CT scan of the head consistent with a right frontal hematoma measuring 97 cc with small IVH and scattered subarachnoid in the right frontal convexity with mass-effect and right-to-left subfalcine herniation and 6 mm midline shift. Rather atypical location for hypertensive bleed but not improbable. Underlying vascular malformation or amyloid or metastases should be evaluated.  Cortical ICH, nontraumatic  Acuity: Acute Laterality: rt Current suspected etiology: Vascular malformation/hypertension/met/amyloid Treatment: -Admit to neurological ICU -ICH Score:3 -ICH Volume: 97 cc -BP control  goal SYS<140 -PT/OT/ST  -neuromonitoring -Repeat CT head at 0100 hrs. -MRI brain with and without contrast when stable  CNS Cerebral edema -Hyperosmolar therapy -3% via peripheral line at 75 cc an hour. -Close neuro monitoring  IVH -Repeat imaging as above -monitor clinically  Dysarthria Dysphagia following ICH  -NPO until cleared by speech -ST -May need PEG  Acute encephalopathy-likely related to cerebral edema -Correct metabolic causes -Monitor  Hemiplegia and hemiparesis following nontraumatic intracerebral hemorrhage affecting left non-dominant side  -PT/OT/ST  RESP Currently protecting airway. Maintaining saturations at room air 97%. Low threshold for intubation. Discussion with family about CODE STATUS as soon as possible  CV -Aggressive BP control, goal SBP < 140 -Cardene drip and labetalol as needed -TTE  GI/GU -Gentle hydration -Was on PPI at home.  Continue PPI  HEME Iron Deficiency Anemia -Monitor -transfuse for hgb < 7 -Trend PT/PTT/INR  ENDO -SSI -goal HgbA1c < 7  Fluid/Electrolyte Disorders -Trend labs -Replete as necessary  ID Possible Aspiration PNA -CXR -NPO -Monitor  Possible UTI -CX pending  Hold off using any antibiotics for now   Prophylaxis DVT: SCD GI: Docusate senna Bowel: PPI  Dispo: SNF  Diet: NPO until cleared by speech  Code Status: Full Code    THE FOLLOWING WERE PRESENT ON ADMISSION: Intracerebral hemorrhage, cerebral herniation, cerebral edema, obstructive hydrocephalus, interventricular hemorrhage, hemiparesis, hemiplegia. Possible aspiration pneumonia History of lung cancer   -- Amie Portland, MD [facility] Pager: [phone] If 7pm to 7am, please call on call as listed on AMION.   CRITICAL CARE ATTESTATION This patient is critically ill and at significant risk of neurological worsening, death and care requires constant monitoring of vital signs, hemodynamics,respiratory and  cardiac monitoring. I spent 45  minutes of neurocritical care time performing neurological assessment, discussion with family, other specialists and medical decision making of high complexityin the care of  this patient.

## 2017-08-20 NOTE — ED Notes (Signed)
Report to [place], RN [facility]

## 2017-08-20 NOTE — ED Notes (Signed)
Report to Apolonio Schneiders, [facility]

## 2017-08-20 NOTE — ED Triage Notes (Addendum)
family reports that he was normal this morning and went to eat breakfast. Reports pt became confused approx 1130 unable to fix pill box as he normally is able to do. Then began vomiting multiple. Last time he vomited 1545. Pt is able to tell me his name and DOB. Unable to tell me city we are in or day of week or president

## 2017-08-20 NOTE — ED Notes (Signed)
Pt urinated on himself, linens and gown changed at this time and pt clean and dry

## 2017-08-20 NOTE — ED Notes (Signed)
Report to [facility] with [facility]

## 2017-08-20 NOTE — ED Provider Notes (Signed)
[facility] EMERGENCY DEPARTMENT Provider Note   CSN: [id number] Arrival date & time: 08/20/17  1547     History   Chief Complaint Chief Complaint  Patient presents with  . Altered Mental Status  . Emesis    HPI Alex [name] is a 84 y.o. male.  HPI  The patient is an 84 year old male, he has a history of alcohol abuse many many years ago, moderate COPD for which she uses an intermittent inhaler, history of lung cancer status post lobectomy in 2007, history of hypertension, acid reflux and dementia.  The patient currently lives by himself, his wife who lives in her own separate residents goes over to check on him daily and when she went there to see him today he was seemingly confused.  He had not filled his pillbox with his normal pills which she usually is able to do.  She reports that he stated he did not know how to do it which is unusual for him.  She reports that she helped him take his medications however as the morning progressed he became nauseated and had multiple episodes of vomiting prehospital.  The patient was unable to tell her what he was feeling, he seemed to appear ill to her however he would not describe symptoms and was not complaining of anything specific.  She denies seeing anything suspicious of the house such as alcohol bottles or drug paraphernalia, the house was not in poor repair and seemed the usual state of affairs at the house.  The patient does endorse having nausea but cannot give me any further details regarding his condition.  He does report a mild headache as well.  Level 5 caveat applies secondary to underlying dementia and acute confusion on top of that  Past Medical History:  Diagnosis Date  . Alcohol abuse    last drink 2003  . Allergy   . Asthma   . COPD, moderate ([place])   . Dementia   . Dementia   . Duodenitis   . Esophageal stricture   . GERD (gastroesophageal reflux disease)   . Herpes zoster   . Hiatal hernia   . Hypertension   .  Inflammatory polyps of colon with rectal bleeding ([place])   . Lung cancer ([place])    rt upper lobe lobectomy 2007 radiation seed implants  . Post herpetic neuralgia   . Renal cyst, right   . Sinusitis   . Strep pharyngitis   . TIA (transient ischemic attack)     Patient Active Problem List   Diagnosis Date Noted  . ICH (intracerebral hemorrhage) ([place]) 08/20/2017  . SOB (shortness of breath) 07/24/2016  . History of lung cancer 07/24/2016  . Pulmonary emphysema ([place]) 07/24/2016  . Aortic atherosclerosis ([place]) 02/03/2016  . History of colonic polyps 10/24/2014  . BPH (benign prostatic hyperplasia) 08/31/2014  . Allergic rhinitis 10/05/2013  . Hyperlipidemia with target LDL less than 100 07/27/2013  . Hypertension 07/27/2013  . GERD (gastroesophageal reflux disease) 07/27/2013  . Dementia 07/27/2013    Past Surgical History:  Procedure Laterality Date  . COLONOSCOPY N/A 11/20/2014   Procedure: COLONOSCOPY;  Surgeon: Danie Binder, MD;  Location: [facility] SU[facility];  Service: Endoscopy;  Laterality: N/A;  1300 - moved to 1:15 - office to notify  . LAPAROSCOPIC CHOLECYSTECTOMY  2003  . LUNG REMOVAL, PARTIAL Right    right upper lung lobectomy - radiation seeds implanted       Home Medications    Prior to Admission medications  Medication Sig Start Date End Date Taking? Authorizing Provider  amLODipine (NORVASC) 5 MG tablet TAKE ONE (1) TABLET EACH DAY 07/29/17   Dettinger, Fransisca Kaufmann, MD  aspirin 81 MG tablet Take 81 mg by mouth daily.      [provider]  cetirizine (ZYRTEC) 10 MG tablet TAKE ONE (1) TABLET EACH DAY 04/28/17   Dettinger, Fransisca Kaufmann, MD  esomeprazole (NEXIUM) 40 MG capsule TAKE ONE (1) CAPSULE EACH DAY 07/29/17   Dettinger, Fransisca Kaufmann, MD  fluticasone (FLONASE) 50 MCG/ACT nasal spray Place 2 sprays into both nostrils daily. 02/15/15   Hassell Done Mary-Margaret, FNP  memantine (NAMENDA) 10 MG tablet TAKE ONE TABLET BY MOUTH TWICE DAILY 07/29/17   Dettinger, Fransisca Kaufmann, MD    metoprolol tartrate (LOPRESSOR) 50 MG tablet TAKE ONE TABLET BY MOUTH TWICE DAILY 07/29/17   Dettinger, Fransisca Kaufmann, MD  Multiple Vitamins-Minerals (CENTRUM SILVER ADULT 50+ PO) Take by mouth daily.    [provider]  PROAIR HFA 108 [phone] Base) MCG/ACT inhaler USE 2 PUFFS 4 TIMES DAILY 04/28/17   Dettinger, Fransisca Kaufmann, MD  simvastatin (ZOCOR) 20 MG tablet TAKE ONE TABLET DAILY AT BEDTIME 07/29/17   Dettinger, Fransisca Kaufmann, MD  umeclidinium-vilanterol Jearl Klinefelter ELLIPTA) 62.5-25 MCG/INH AEPB One puff daily Patient not taking: Reported on 08/02/2017 07/24/16   Claretta Fraise, MD    Family History Family History  Problem Relation Age of Onset  . Cancer Mother        Breast Cancer  . Heart disease Mother 68       MI  . Aneurysm Father   . Heart disease Father   . Alcohol abuse Brother   . Heart disease Brother   . Cancer Sister        breast  . COPD Brother   . Colon cancer Neg Hx     Social History Social History   Tobacco Use  . Smoking status: Former Smoker    Types: Cigarettes    Last attempt to quit: 01/16/2002    Years since quitting: 15.6  . Smokeless tobacco: Never Used  Substance Use Topics  . Alcohol use: No    Alcohol/week: 0.0 oz    Comment: Last alcohol use 2003  . Drug use: No     Allergies   Patient has no known allergies.   Review of Systems Review of Systems  All other systems reviewed and are negative.    Physical Exam Updated Vital Signs BP (!) 142/76   Pulse 94   Temp 97.9 F (36.6 C) (Rectal)   Resp (!) 22   Wt 67.1 kg (148 lb)   SpO2 98%   BMI 23.18 kg/m   Physical Exam  Constitutional: He appears well-developed and well-nourished. No distress.  HENT:  Head: Normocephalic and atraumatic.  Mouth/Throat: No oropharyngeal exudate.  Slightly pale conjunctive a mucous membranes  Eyes: Conjunctivae and EOM are normal. Pupils are equal, round, and reactive to light. Right eye exhibits no discharge. Left eye exhibits no discharge. No scleral  icterus.  Neck: Normal range of motion. Neck supple. No JVD present. No thyromegaly present.  Cardiovascular: Normal rate, regular rhythm, normal heart sounds and intact distal pulses. Exam reveals no gallop and no friction rub.  No murmur heard. Pulmonary/Chest: Effort normal. No respiratory distress. He has wheezes ( Wheezing on expiration in all lung fields). He has no rales.  Abdominal: Soft. Bowel sounds are normal. He exhibits no distension and no mass. There is no tenderness.  Soft nondistended abdomen,  there is no tenderness with diffuse palpation, no pulsating masses  Musculoskeletal: Normal range of motion. He exhibits no edema or tenderness.  No edema of the lower extremities, no deformities of the extremities, supple joints and soft compartments diffusely  Lymphadenopathy:    He has no cervical adenopathy.  Neurological: He is alert. Coordination normal.  The patient is able to ambulate with the assistance of a nurse, he does not seem to be off balance when he walks, he walks very slowly, he is able to follow commands but very slowly and has to think about simple commands such as showing me 2 fingers and the thumb.  His speech seems clear but very slow, he knows his home address and knows the people in the room but has difficulty telling me who the president is which is unusual for him.  Skin: Skin is warm and dry. No rash noted. No erythema.  Psychiatric: He has a normal mood and affect. His behavior is normal.  Nursing note and vitals reviewed.    ED Treatments / Results  Labs (all labs ordered are listed, but only abnormal results are displayed) Labs Reviewed  COMPREHENSIVE METABOLIC PANEL - Abnormal; Notable for the following components:      Result Value   Glucose, Bld 156 (*)    ALT 12 (*)    All other components within normal limits  CBC WITH DIFFERENTIAL/PLATELET - Abnormal; Notable for the following components:   WBC 11.7 (*)    RBC 3.79 (*)    Hemoglobin 11.3 (*)     HCT 36.4 (*)    Neutro Abs 10.0 (*)    All other components within normal limits  BLOOD GAS, ARTERIAL - Abnormal; Notable for the following components:   pO2, Arterial 82.7 (*)    Acid-Base Excess 3.8 (*)    All other components within normal limits  CBG MONITORING, ED - Abnormal; Notable for the following components:   Glucose-Capillary 155 (*)    All other components within normal limits  URINE CULTURE  AMMONIA  ETHANOL  TROPONIN I  PROTIME-INR  URINALYSIS, COMPLETE (UACMP) WITH MICROSCOPIC  RAPID URINE DRUG SCREEN, HOSP PERFORMED  I-STAT CG4 LACTIC ACID, ED    EKG  EKG Interpretation  Date/Time:  Friday August 20 2017 16:44:26 EST Ventricular Rate:  60 PR Interval:    QRS Duration: 159 QT Interval:  541 QTC Calculation: 541 R Axis:   73 Text Interpretation:  Sinus rhythm Probable left ventricular hypertrophy Borderline T abnormalities, lateral leads Prolonged QT interval Non-specific intra-ventricular conduction delay Artifact Abnormal ekg Confirmed by Carmin Muskrat [phone]) on 08/20/2017 4:55:02 PM       Radiology Ct Angio Head W Or Wo Contrast  Result Date: 08/20/2017 CLINICAL DATA:  Follow-up subarachnoid hemorrhage EXAM: CT ANGIOGRAPHY HEAD TECHNIQUE: Multidetector CT imaging of the head was performed using the standard protocol during bolus administration of intravenous contrast. Multiplanar CT image reconstructions and MIPs were obtained to evaluate the vascular anatomy. CONTRAST:  80 cc ISOVUE-370 IOPAMIDOL (ISOVUE-370) INJECTION 76% COMPARISON:  CT HEAD August 20, 2017 at 1653 hours. FINDINGS: 1. Mildly motion degraded examination. ANTERIOR CIRCULATION: Patent cervical internal carotid arteries, petrous, cavernous and supra clinoid internal carotid arteries. Patent anterior communicating artery. Patent anterior and middle cerebral arteries. Bilateral anterior cerebral arteries displaced to the LEFT by RIGHT frontal intraparenchymal hematoma. No large vessel occlusion,  significant stenosis, contrast extravasation or aneurysm. POSTERIOR CIRCULATION: LEFT vertebral artery is dominant. Patent vertebral arteries, vertebrobasilar junction and basilar artery, as well  as main branch vessels. Patent posterior cerebral arteries. Robust bilateral posterior communicating arteries. No large vessel occlusion, significant stenosis, contrast extravasation or aneurysm. VENOUS SINUSES: Major dural venous sinuses are patent though not tailored for evaluation on this angiographic examination. ANATOMIC VARIANTS: None. DELAYED PHASE: Moderately motion degraded. No definite suspicious enhancement though limited assessment. MIP images reviewed. IMPRESSION: 1. Motion degraded examination. No aneurysm, flow-limiting stenosis or emergent large vessel occlusion. 2. Occult aneurysm not excluded, consider DSA. Neuro-Interventional Radiology consultation is suggested and can be arranged by calling [phone] during usual hours. Emergency evaluation can be requested by paging [phone]. Electronically Signed   By: Elon Alas M.D.   On: 08/20/2017 18:52   Ct Head Wo Contrast  Result Date: 08/20/2017 CLINICAL DATA:  83 y/o M; altered level of consciousness unexplained. EXAM: CT HEAD WITHOUT CONTRAST TECHNIQUE: Contiguous axial images were obtained from the base of the skull through the vertex without intravenous contrast. COMPARISON:  08/08/2010 MRI head. FINDINGS: Brain: Acute hemorrhage in right frontal lobe spanning 5.5 x 6.6 x 5.1 cm (volume = 97 cm^3) with edema and mass effect effacing frontal horn of right lateral ventricle, resulting in right to left subfalcine herniation and up to 6 mm of anterior midline shift. Hematoma extends into genu of corpus callosum and there is a small volume of intraventricular hemorrhage pooling in the occipital horns of lateral ventricles. Additionally, there is a small volume of subarachnoid hemorrhage over the right frontal convexity extending into the MCA  cistern and suprasellar cistern. Ventricle size is stable from the prior MRI of the brain. Stable background of chronic microvascular ischemic changes and parenchymal volume loss of the brain. Vascular: Calcific atherosclerosis of carotid siphons. No hyperdense vessel identified. Skull: Normal. Negative for fracture or focal lesion. Sinuses/Orbits: No acute finding. Other: None. IMPRESSION: 1. Acute hemorrhage in right frontal lobe measuring up to 6.6 cm, approximately 97 cc. 2. Small volume intraventricular hemorrhage pooling in occipital horns and subarachnoid hemorrhage over right frontal convexity, MCA cistern, suprasellar cistern. 3. Mass effect with right to left subfalcine herniation and 6 mm anterior midline shift. Critical Value/emergent results were called by telephone at the time of interpretation on 08/20/2017 at 5:22 pm to Dr. Noemi Chapel , who verbally acknowledged these results. Electronically Signed   By: Kristine Garbe M.D.   On: 08/20/2017 17:24   Dg Chest Port 1 View  Result Date: 08/20/2017 CLINICAL DATA:  Wheezing, confused. Hx of asthma, COPD, dementia, HTN, lung cancer- right upper lobectomy 2007 with seed implants. Former smoker. EXAM: PORTABLE CHEST 1 VIEW COMPARISON:  08/26/2016 FINDINGS: Cardiac silhouette is normal in size. No mediastinal or hilar masses. No convincing adenopathy. Stable postsurgical changes on the right with vascular clips and pulmonary anastomosis projecting from the mid through the upper lung zone in the right upper lobe. Lungs are hyperexpanded. No evidence of pneumonia or pulmonary edema. No pleural effusion or pneumothorax. Skeletal structures are grossly intact. IMPRESSION: 1. No acute cardiopulmonary disease. 2. COPD and stable changes from prior right lung surgery. Electronically Signed   By: Lajean Manes M.D.   On: 08/20/2017 17:25    Procedures .Critical Care Performed by: Noemi Chapel, MD Authorized by: Noemi Chapel, MD   Critical care  provider statement:    Critical care time (minutes):  35   Critical care time was exclusive of:  Separately billable procedures and treating other patients and teaching time   Critical care was necessary to treat or prevent imminent or life-threatening deterioration of the following  conditions:  CNS failure or compromise   Critical care was time spent personally by me on the following activities:  Blood draw for specimens, development of treatment plan with patient or surrogate, discussions with consultants, evaluation of patient's response to treatment, examination of patient, obtaining history from patient or surrogate, ordering and performing treatments and interventions, ordering and review of laboratory studies, ordering and review of radiographic studies, pulse oximetry, re-evaluation of patient's condition and review of old charts   (including critical care time)  Medications Ordered in ED Medications  ondansetron (ZOFRAN) injection 4 mg (not administered)  ondansetron (ZOFRAN-ODT) disintegrating tablet 4 mg (4 mg Oral Given 08/20/17 1637)  nicardipine (CARDENE) 20mg  in 0.86% saline 267ml IV infusion (0.1 mg/ml) (3 mg/hr Intravenous Rate/Dose Change 08/20/17 1745)  iopamidol (ISOVUE-370) 76 % injection 80 mL ( Intravenous Contrast Given 08/20/17 1815)     Initial Impression / Assessment and Plan / ED Course  I have reviewed the triage vital signs and the nursing notes.  Pertinent labs & imaging results that were available during my care of the patient were reviewed by me and considered in my medical decision making (see chart for details).  Clinical Course as of Aug 20 1901  Fri Aug 20, 2017  1726 Case was discussed with the radiologist at 5:20 PM, there does appear to be a large right frontal hemorrhage with some amount of subarachnoid hemorrhage.  There is some subfalcine herniation, 6 mm of right-to-left shift.  [BM]  1727 Neuro hospitalist paged for consultation.  Blood pressure will be  aggressively managed with Cardene on a drip  [BM]  1727 The patient is critically ill with a significant intracranial hemorrhage.  There is no signs of trauma, this is suspected to be a spontaneous hemorrhagic stroke likely from hypertensive origin, the patient is not on blood thinners.  [BM]  1803 Discussed case with neurology at 6:00 PM, they recommend CT angiogram to look for possible aneurysm, accepted to ICU bed pending CT scan  [BM]    Clinical Course User Index [BM] Noemi Chapel, MD    The etiology of the patient's acute confusion could be multifactorial including stroke, hemorrhage, GI bleed, infectious, metabolic, the patient may have taken too much of his medications this morning however his vital signs are not that remarkable.  I do not hear any murmurs, he does not have any signs of an aneurysm intra-abdominally, he has no edema, no focal neurologic deficits but definitely seems to be slightly confused and off per his wife.  He is still holding on the emesis bag and intermittently dry heaving but has not vomited.  Discussed with Dr. Leonel Ramsay of the neurology service, CT angiogram negative for obvious aneurysm, patient has been accepted to the intensive care unit at [facility]  Final Clinical Impressions(s) / ED Diagnoses   Final diagnoses:  Intracranial hemorrhage [facility])  Hypertensive emergency      Noemi Chapel, MD 08/20/17 [phone]

## 2017-08-21 ENCOUNTER — Inpatient Hospital Stay (HOSPITAL_COMMUNITY): Payer: Medicare Other

## 2017-08-21 ENCOUNTER — Other Ambulatory Visit: Payer: Self-pay

## 2017-08-21 ENCOUNTER — Encounter (HOSPITAL_COMMUNITY): Payer: Medicare Other

## 2017-08-21 DIAGNOSIS — F0390 Unspecified dementia without behavioral disturbance: Secondary | ICD-10-CM

## 2017-08-21 DIAGNOSIS — G936 Cerebral edema: Secondary | ICD-10-CM

## 2017-08-21 DIAGNOSIS — I611 Nontraumatic intracerebral hemorrhage in hemisphere, cortical: Principal | ICD-10-CM

## 2017-08-21 DIAGNOSIS — I10 Essential (primary) hypertension: Secondary | ICD-10-CM

## 2017-08-21 DIAGNOSIS — R1312 Dysphagia, oropharyngeal phase: Secondary | ICD-10-CM

## 2017-08-21 LAB — CBC WITH DIFFERENTIAL/PLATELET
Band Neutrophils: 0 %
Basophils Absolute: 0 10*3/uL (ref 0.0–0.1)
Basophils Relative: 0 %
Blasts: 0 %
Eosinophils Absolute: 0 10*3/uL (ref 0.0–0.7)
Eosinophils Relative: 0 %
HCT: 35.9 % — ABNORMAL LOW (ref 39.0–52.0)
Hemoglobin: 11.6 g/dL — ABNORMAL LOW (ref 13.0–17.0)
Lymphocytes Relative: 7 %
Lymphs Abs: 1.1 10*3/uL (ref 0.7–4.0)
MCH: 30.9 pg (ref 26.0–34.0)
MCHC: 32.3 g/dL (ref 30.0–36.0)
MCV: 95.7 fL (ref 78.0–100.0)
Metamyelocytes Relative: 0 %
Monocytes Absolute: 0.9 10*3/uL (ref 0.1–1.0)
Monocytes Relative: 6 %
Myelocytes: 0 %
Neutro Abs: 13.2 10*3/uL — ABNORMAL HIGH (ref 1.7–7.7)
Neutrophils Relative %: 87 %
Platelets: 163 10*3/uL (ref 150–400)
Promyelocytes Absolute: 0 %
RBC: 3.75 MIL/uL — ABNORMAL LOW (ref 4.22–5.81)
RDW: 13.8 % (ref 11.5–15.5)
WBC: 15.2 10*3/uL — ABNORMAL HIGH (ref 4.0–10.5)
nRBC: 0 /100 WBC

## 2017-08-21 LAB — COMPREHENSIVE METABOLIC PANEL
ALBUMIN: 3.5 g/dL (ref 3.5–5.0)
ALK PHOS: 79 U/L (ref 38–126)
ALT: 11 U/L — ABNORMAL LOW (ref 17–63)
ANION GAP: 12 (ref 5–15)
AST: 29 U/L (ref 15–41)
BILIRUBIN TOTAL: 0.8 mg/dL (ref 0.3–1.2)
BUN: 12 mg/dL (ref 6–20)
CALCIUM: 8.6 mg/dL — AB (ref 8.9–10.3)
CO2: 25 mmol/L (ref 22–32)
CREATININE: 0.96 mg/dL (ref 0.61–1.24)
Chloride: 99 mmol/L — ABNORMAL LOW (ref 101–111)
GFR calc Af Amer: >60 mL/min (ref >60)
GFR calc non Af Amer: >60 mL/min (ref >60)
GLUCOSE: 138 mg/dL — AB (ref 65–99)
Potassium: 4.4 mmol/L (ref 3.5–5.1)
Sodium: 136 mmol/L (ref 135–145)
TOTAL PROTEIN: 6.9 g/dL (ref 6.5–8.1)

## 2017-08-21 LAB — APTT: APTT: 32 s (ref 24–36)

## 2017-08-21 LAB — URINALYSIS, ROUTINE W REFLEX MICROSCOPIC
BILIRUBIN URINE: NEGATIVE
Bacteria, UA: NONE SEEN
GLUCOSE, UA: NEGATIVE mg/dL
Ketones, ur: 5 mg/dL — AB
LEUKOCYTES UA: NEGATIVE
NITRITE: NEGATIVE
PH: 6 (ref 5.0–8.0)
Protein, ur: NEGATIVE mg/dL
SPECIFIC GRAVITY, URINE: 1.02 (ref 1.005–1.030)

## 2017-08-21 LAB — SODIUM
Sodium: 140 mmol/L (ref 135–145)
Sodium: 143 mmol/L (ref 135–145)
Sodium: 145 mmol/L (ref 135–145)

## 2017-08-21 LAB — RAPID URINE DRUG SCREEN, HOSP PERFORMED
Amphetamines: NOT DETECTED
Barbiturates: NOT DETECTED
Benzodiazepines: NOT DETECTED
Cocaine: NOT DETECTED
Opiates: NOT DETECTED
Tetrahydrocannabinol: NOT DETECTED

## 2017-08-21 LAB — SURGICAL PCR SCREEN
MRSA, PCR: NEGATIVE
STAPHYLOCOCCUS AUREUS: NEGATIVE

## 2017-08-21 LAB — PROTIME-INR
INR: 1.12
Prothrombin Time: 14.3 seconds (ref 11.4–15.2)

## 2017-08-21 MED ORDER — ORAL CARE MOUTH RINSE
15.0000 mL | Freq: Two times a day (BID) | OROMUCOSAL | Status: DC
  Administered 2017-08-21 – 2017-08-24 (×7): 15 mL via OROMUCOSAL

## 2017-08-21 MED ORDER — CHLORHEXIDINE GLUCONATE 0.12 % MT SOLN
15.0000 mL | Freq: Two times a day (BID) | OROMUCOSAL | Status: DC
  Administered 2017-08-21 – 2017-08-24 (×7): 15 mL via OROMUCOSAL
  Filled 2017-08-21 (×2): qty 15

## 2017-08-21 MED ORDER — GADOBENATE DIMEGLUMINE 529 MG/ML IV SOLN
13.0000 mL | Freq: Once | INTRAVENOUS | Status: AC | PRN
  Administered 2017-08-21: 13 mL via INTRAVENOUS

## 2017-08-21 MED ORDER — CLEVIDIPINE BUTYRATE 0.5 MG/ML IV EMUL
0.0000 mg/h | INTRAVENOUS | Status: DC
  Administered 2017-08-21: 3 mg/h via INTRAVENOUS
  Administered 2017-08-21: 1 mg/h via INTRAVENOUS
  Administered 2017-08-22: 8 mg/h via INTRAVENOUS
  Administered 2017-08-22: 6 mg/h via INTRAVENOUS
  Administered 2017-08-22: 4 mg/h via INTRAVENOUS
  Administered 2017-08-22: 7 mg/h via INTRAVENOUS
  Administered 2017-08-22: 12 mg/h via INTRAVENOUS
  Administered 2017-08-23: 8 mg/h via INTRAVENOUS
  Filled 2017-08-21 (×8): qty 50

## 2017-08-21 NOTE — Progress Notes (Signed)
STROKE TEAM PROGRESS NOTE   SUBJECTIVE (INTERVAL HISTORY) His RN is at the bedside.  I also talked with wife and daughter over the phone.  They are discussing about possible central line for continued 3% saline.  Patient right gaze preference, severe dysarthria, but orientated to place, name and age, but not to time.  Left upper extremity flaccid, left lower extremity mild weakness.  Currently on 3% saline, and Cardene for BP control.  Will change to Cleviprex.   OBJECTIVE Temp:  [97.5 F (36.4 C)-101 F (38.3 C)] 99.3 F (37.4 C) (03/09 0800) Pulse Rate:  [55-118] 114 (03/09 0900) Cardiac Rhythm: Sinus tachycardia (03/09 0800) Resp:  [16-28] 17 (03/09 0900) BP: (104-182)/(56-120) 119/58 (03/09 0900) SpO2:  [90 %-100 %] 97 % (03/09 0900) Weight:  [148 lb (67.1 kg)] 148 lb (67.1 kg) (03/08 1605)  CBC:  Recent Labs  Lab 08/20/17 1633 08/21/17 0430  WBC 11.7* 15.2*  NEUTROABS 10.0* 13.2*  HGB 11.3* 11.6*  HCT 36.4* 35.9*  MCV 96.0 95.7  PLT 185 767    Basic Metabolic Panel:  Recent Labs  Lab 08/20/17 1633 08/20/17 2254 08/21/17 0430  NA 140 134* 136  K 4.2  --  4.4  CL 101  --  99*  CO2 29  --  25  GLUCOSE 156*  --  138*  BUN 14  --  12  CREATININE 1.08  --  0.96  CALCIUM 9.2  --  8.6*    Lipid Panel:     Component Value Date/Time   CHOL 127 08/02/2017 1041   TRIG 111 08/02/2017 1041   TRIG 131 08/31/2014 1054   HDL 51 08/02/2017 1041   HDL 52 08/31/2014 1054   CHOLHDL 2.5 08/02/2017 1041   CHOLHDL 3.6 08/07/2010 0700   VLDL 16 08/07/2010 0700   LDLCALC 54 08/02/2017 1041   LDLCALC 65 10/05/2013 1636   HgbA1c: No results found for: HGBA1C Urine Drug Screen: No results found for: LABOPIA, COCAINSCRNUR, LABBENZ, AMPHETMU, THCU, LABBARB  Alcohol Level     Component Value Date/Time   ETH <10 08/20/2017 1633    IMAGING I have personally reviewed the radiological images below and agree with the radiology interpretations.  Ct Angio Head W Or Wo  Contrast 08/20/2017 IMPRESSION:  1. Motion degraded examination. No aneurysm, flow-limiting stenosis or emergent large vessel occlusion.  2. Occult aneurysm not excluded, consider DSA.   Ct Head Wo Contrast 08/20/2017 IMPRESSION:  1. Acute hemorrhage in right frontal lobe measuring up to 6.6 cm, approximately 97 cc.  2. Small volume intraventricular hemorrhage pooling in occipital horns and subarachnoid hemorrhage over right frontal convexity, MCA cistern, suprasellar cistern.  3. Mass effect with right to left subfalcine herniation and 6 mm anterior midline shift.   Mr Jeri Cos Wo Contrast 08/21/2017 IMPRESSION:  1. Slight interval increase in size of large cortically based right frontal hematoma, now measuring 118 cc. Slightly increased regional mass effect with up to 11 mm right-to-left subfalcine herniation. No underlying mass lesion or discernible vascular abnormality. No imaging findings to suggest cerebral amyloid angiopathy.  2. Associated subarachnoid hemorrhage with right frontotemporal subdural hemorrhage as above. Small volume intraventricular hemorrhage within the lateral ventricles with stable ventricular dilatation.   Chest Port 1 View 08/20/2017 IMPRESSION:  1. Emphysema with patchy atelectasis or minimal infiltrate at the left lung base.  2. Stable postsurgical changes of the right lung.   Dg Chest Port 1 View 08/20/2017 IMPRESSION:  1. No acute cardiopulmonary disease.  2. COPD  and stable changes from prior right lung surgery.   Transthoracic Echocardiogram - pending  Bilateral Carotid Dopplers - pending   PHYSICAL EXAM Vitals:   08/21/17 0645 08/21/17 0700 08/21/17 0800 08/21/17 0900  BP: 125/72 120/61 (!) 130/57 (!) 119/58  Pulse: (!) 118 (!) 113 (!) 117 (!) 114  Resp: 17 (!) 21 19 17   Temp:   99.3 F (37.4 C)   TempSrc:   Oral   SpO2: 98% 96% 96% 97%  Weight:        Temp:  [97.5 F (36.4 C)-101 F (38.3 C)] 99.3 F (37.4 C) (03/09 0800) Pulse Rate:   [55-118] 114 (03/09 0900) Resp:  [16-28] 17 (03/09 0900) BP: (104-182)/(56-120) 119/58 (03/09 0900) SpO2:  [90 %-100 %] 97 % (03/09 0900) Weight:  [148 lb (67.1 kg)] 148 lb (67.1 kg) (03/08 1605)  General - Well nourished, well developed, in no apparent distress.  Ophthalmologic - Fundi not visualized due to noncooperation.  Cardiovascular - Regular rate and rhythm.  Neuro -sleepy, but able to open eyes with repetitive stimulation.  Orientated to name, age, and place, however not orientated to time.  Severe dysarthria.  Does not following simple commands, very limited language output, did not name or repeat.  Forced right gaze, not able to cross midline.  PERRL, not tracking object.  Blinking to visual threat on the right, not on the left.  Left facial droop, tongue midline in mouth.  Left upper extremity flaccid, 1/5 on pain summation.  Left lower extremity barely against gravity on pain stimulation.  Right UE and LE spontaneous movement and against gravity.  DTR 1+, Babinski test not cooperative. Sensation, coordination and gait not tested.    ASSESSMENT/PLAN Alex [name] is a 84 y.o. male with history of alcohol abuse, dementia, COPD, lung cancer with resection in 2007, TIA history, hypertension, and dementia presenting with worsening confusion. He did not receive IV t-PA due to [place].  ICH - right frontal ICH, etiology unclear, concerning for CAA  Resultant right gaze, left hemiparesis  CT head - Acute hemorrhage in right frontal lobe   MRI head - Slight interval increase in size of large cortically based right frontal hematoma  CTA Head - Motion degraded examination. Occult aneurysm not excluded, consider DSA.  Carotid Doppler - pending  2D Echo - pending  LDL - 54  HgbA1c - pending  VTE prophylaxis - SCDs Diet NPO time specified Fall precautions  aspirin 81 mg daily prior to admission, now on No antithrombotic  Ongoing aggressive stroke risk factor  management  Therapy recommendations:  pending  Disposition:  Pending  Code status - DNR  Cerebral edema  CT and MRI showed large right frontal ICH  Midline shift with sub falcine herniation  On 3% saline  Na goal 150-155  Na 136->140->143  Na Q6h  Hypertension  Stable  On cleviprex  BP goal < 140  Hyperlipidemia  Home meds: Zocor 20 mg daily PTA  LDL 54, goal < 70  Statin on hold now due to [place]  Continue statin at discharge  Dysphagia   Did not pass swallow  Family do not want PEG  Speech following   NPO for now  Consider tube feeding   Leukocytosis   WBC 15.2   temp 99.3   U/A neg  CXR pending in am  Other Stroke Risk Factors  Advanced age  Former cigarette smoker - quit 15 years ago  Hx of Alcohol abuse   Hx stroke/TIA  Other Active Problems  Dementia - in [place]   Lung cancer 2007  COPD  Hospital day # 1  This patient is critically ill due to right frontal large ICH, cerebral edema, HTN, dysphagia, dementia and at significant risk of neurological worsening, death form hematoma enlargement, brain herniation, delirium, aspiration. This patient's care requires constant monitoring of vital signs, hemodynamics, respiratory and cardiac monitoring, review of multiple databases, neurological assessment, discussion with family, other specialists and medical decision making of high complexity. I had long discussion with daughter and wife on the phone, updated pt current condition, treatment plan and potential prognosis. They expressed understanding and appreciation.  I spent 40 minutes of neurocritical care time in the care of this patient.  Rosalin Hawking, MD PhD Stroke Neurology 08/21/2017 5:37 PM  To contact Stroke Continuity provider, please refer to [email or web address] After hours, contact General Neurology

## 2017-08-21 NOTE — Progress Notes (Signed)
Code Status update Discussed on phone with wife. Pt wants to be a DNR and had multiple discussions with wife, re: DNR. Code status changed in chart.  -- Amie Portland, MD [facility] Pager: [phone] If 7pm to 7am, please call on call as listed on AMION.

## 2017-08-21 NOTE — Procedures (Addendum)
CENTRAL VENOUS CATHETER PLACEMENT FOR HYPERTONIC SALINE INFUSION REQUESTED BY NEUROLOGY .  INFORMED CONSENT SIGNED BY FAMILY.  POSITION- [place].  USG PROBE-CONFIRMED LOCATION & PATENCY OF RIGHT IJ AND SUBCLAVIAN VEINS.  FULL STERILE PRECAUTIONS- GLOVES, GOWN, MASK, SHIELD, FULL DRAPE USED.  TIME OUT PERFORMED.  LOCAL ANESTHETIC = 1% LIDOCAINE FOR SKIN & SUBCUT. TISSUES.  R SUBCLAVIAN VEIN ACCESSED IN 1ST ATTEMPT.  TLC PLACED BY SELDINGER TECHNIQUE.  ALL 3 PORTS ASPIRATED OF BLOOD & FLUSHED WITH SALINE.  SECURED WITH SUTURES AT 15 CM.  STERILE DRESSING APPLIED.  POST PROCEDURE: SONO>> LUNG & PLEURAL SLIDING SEEN-> THIS CONFIRMS ABSENCE OF  PTX.   CXR- TIP OF CVL IN SVC; NO PTX.  OK TO  USE CVC.  PT TOLERATED PROCEDURE WELL; NO COMPLICATIONS.       Evans Lance, MD [facility]monary [facility] Pager: [phone]

## 2017-08-21 NOTE — Progress Notes (Signed)
PT Cancellation Note  Patient Details Name: Alex [name] MRN: [id number] DOB: 1933-03-27   Cancelled Treatment:    Reason Eval/Treat Not Completed: Patient not medically ready. Pt remains on bedrest per MD. Acute PT to return as able once mobility orders updated.  Kittie Plater, PT, DPT Pager #: [phone] Office #: [phone]    [place] 08/21/2017, 10:25 AM

## 2017-08-21 NOTE — Progress Notes (Signed)
SLP Cancellation Note  Patient Details Name: Alex [name] MRN: [id number] DOB: Apr 19, 1933   Cancelled treatment:       Reason Eval/Treat Not Completed: Fatigue/lethargy limiting ability to participate. Will f/u next date.  Deneise Lever, [place], [place] Speech-Language Pathologist [phone]   Aliene Altes 08/21/2017, 2:22 PM

## 2017-08-22 ENCOUNTER — Inpatient Hospital Stay (HOSPITAL_COMMUNITY): Payer: Medicare Other

## 2017-08-22 DIAGNOSIS — I10 Essential (primary) hypertension: Secondary | ICD-10-CM

## 2017-08-22 DIAGNOSIS — I611 Nontraumatic intracerebral hemorrhage in hemisphere, cortical: Secondary | ICD-10-CM

## 2017-08-22 DIAGNOSIS — G936 Cerebral edema: Secondary | ICD-10-CM

## 2017-08-22 DIAGNOSIS — R1310 Dysphagia, unspecified: Secondary | ICD-10-CM

## 2017-08-22 DIAGNOSIS — I615 Nontraumatic intracerebral hemorrhage, intraventricular: Secondary | ICD-10-CM

## 2017-08-22 LAB — BASIC METABOLIC PANEL
Anion gap: 8 (ref 5–15)
BUN: 12 mg/dL (ref 6–20)
CALCIUM: 8.5 mg/dL — AB (ref 8.9–10.3)
CO2: 26 mmol/L (ref 22–32)
CREATININE: 0.97 mg/dL (ref 0.61–1.24)
Chloride: 113 mmol/L — ABNORMAL HIGH (ref 101–111)
GFR calc non Af Amer: >60 mL/min (ref >60)
Glucose, Bld: 143 mg/dL — ABNORMAL HIGH (ref 65–99)
Potassium: 3.5 mmol/L (ref 3.5–5.1)
SODIUM: 147 mmol/L — AB (ref 135–145)

## 2017-08-22 LAB — CBC
HCT: 34 % — ABNORMAL LOW (ref 39.0–52.0)
Hemoglobin: 10.6 g/dL — ABNORMAL LOW (ref 13.0–17.0)
MCH: 30.5 pg (ref 26.0–34.0)
MCHC: 31.2 g/dL (ref 30.0–36.0)
MCV: 97.7 fL (ref 78.0–100.0)
Platelets: 187 10*3/uL (ref 150–400)
RBC: 3.48 MIL/uL — ABNORMAL LOW (ref 4.22–5.81)
RDW: 14.8 % (ref 11.5–15.5)
WBC: 13.8 10*3/uL — ABNORMAL HIGH (ref 4.0–10.5)

## 2017-08-22 LAB — SODIUM
SODIUM: 147 mmol/L — AB (ref 135–145)
SODIUM: 149 mmol/L — AB (ref 135–145)
SODIUM: 157 mmol/L — AB (ref 135–145)
Sodium: 153 mmol/L — ABNORMAL HIGH (ref 135–145)

## 2017-08-22 LAB — HEMOGLOBIN A1C
Hgb A1c MFr Bld: 5.4 % (ref 4.8–5.6)
MEAN PLASMA GLUCOSE: 108 mg/dL

## 2017-08-22 LAB — ECHOCARDIOGRAM COMPLETE: WEIGHTICAEL: 2368 [oz_av]

## 2017-08-22 LAB — TRIGLYCERIDES: TRIGLYCERIDES: 89 mg/dL (ref <150)

## 2017-08-22 MED ORDER — METOPROLOL TARTRATE 5 MG/5ML IV SOLN
5.0000 mg | Freq: Four times a day (QID) | INTRAVENOUS | Status: DC
  Administered 2017-08-22 – 2017-08-23 (×4): 5 mg via INTRAVENOUS
  Filled 2017-08-22 (×4): qty 5

## 2017-08-22 MED ORDER — SODIUM CHLORIDE 3 % IV SOLN
INTRAVENOUS | Status: DC
  Administered 2017-08-22 (×3): 75 mL/h via INTRAVENOUS
  Filled 2017-08-22 (×8): qty 500

## 2017-08-22 MED ORDER — HEPARIN SODIUM (PORCINE) 5000 UNIT/ML IJ SOLN
5000.0000 [IU] | Freq: Three times a day (TID) | INTRAMUSCULAR | Status: DC
  Administered 2017-08-22 – 2017-08-23 (×3): 5000 [IU] via SUBCUTANEOUS
  Filled 2017-08-22 (×3): qty 1

## 2017-08-22 MED ORDER — CHLORHEXIDINE GLUCONATE CLOTH 2 % EX PADS
6.0000 | MEDICATED_PAD | Freq: Every day | CUTANEOUS | Status: DC
  Administered 2017-08-22 – 2017-08-23 (×2): 6 via TOPICAL

## 2017-08-22 MED ORDER — PIPERACILLIN-TAZOBACTAM 3.375 G IVPB
3.3750 g | Freq: Three times a day (TID) | INTRAVENOUS | Status: DC
  Administered 2017-08-22 – 2017-08-23 (×4): 3.375 g via INTRAVENOUS
  Filled 2017-08-22 (×5): qty 50

## 2017-08-22 MED ORDER — ALBUTEROL SULFATE (2.5 MG/3ML) 0.083% IN NEBU
2.5000 mg | INHALATION_SOLUTION | Freq: Four times a day (QID) | RESPIRATORY_TRACT | Status: DC | PRN
  Administered 2017-08-22 – 2017-08-23 (×3): 2.5 mg via RESPIRATORY_TRACT
  Filled 2017-08-22 (×3): qty 3

## 2017-08-22 MED ORDER — ALBUTEROL SULFATE HFA 108 (90 BASE) MCG/ACT IN AERS: 2.0000 | INHALATION_SPRAY | Freq: Four times a day (QID) | RESPIRATORY_TRACT | Status: DC | PRN

## 2017-08-22 MED ORDER — ACETYLCYSTEINE 20 % IN SOLN
3.0000 mL | Freq: Three times a day (TID) | RESPIRATORY_TRACT | Status: DC
  Administered 2017-08-22: 14:00:00 via RESPIRATORY_TRACT
  Administered 2017-08-22: 3 mL via RESPIRATORY_TRACT
  Administered 2017-08-23: 4 mL via RESPIRATORY_TRACT
  Administered 2017-08-23: 3 mL via RESPIRATORY_TRACT
  Filled 2017-08-22 (×5): qty 4

## 2017-08-22 MED ORDER — SODIUM CHLORIDE 0.9% FLUSH: 10.0000 mL | INTRAVENOUS | Status: DC | PRN

## 2017-08-22 MED ORDER — SODIUM CHLORIDE 0.9% FLUSH
10.0000 mL | Freq: Two times a day (BID) | INTRAVENOUS | Status: DC
  Administered 2017-08-22 – 2017-08-23 (×3): 10 mL

## 2017-08-22 NOTE — Progress Notes (Signed)
SLP Cancellation Note  Patient Details Name: Alex [name] MRN: [id number] DOB: 1933-04-04   Cancelled treatment:       Reason Eval/Treat Not Completed: Fatigue/lethargy limiting ability to participate. Per RN no changes today; will follow up next date.  Deneise Lever, [place], [place] Speech-Language Pathologist [place] 08/22/2017, 8:46 AM

## 2017-08-22 NOTE — Progress Notes (Signed)
  Echocardiogram 2D Echocardiogram has been performed.  Alex [name] Alex [name] 08/22/2017, 3:12 PM

## 2017-08-22 NOTE — Progress Notes (Signed)
OT Cancellation Note  Patient Details Name: HARUKI ARNOLD MRN: [id number] DOB: September 06, 1932   Cancelled Treatment:    Reason Eval/Treat Not Completed: Active bedrest order.  Binnie Kand M.S., OTR/L Pager: [phone]  08/22/2017, 7:51 AM

## 2017-08-22 NOTE — Evaluation (Signed)
Clinical/Bedside Swallow Evaluation Patient Details  Name: DAVIDMICHAEL ZARAZUA MRN: [id number] Date of Birth: 1933-05-11  Today's Date: 08/22/2017 Time: SLP Start Time (ACUTE ONLY): 67 SLP Stop Time (ACUTE ONLY): 0950 SLP Time Calculation (min) (ACUTE ONLY): 30 min  Past Medical History:  Past Medical History:  Diagnosis Date  . Alcohol abuse    last drink 2003  . Allergy   . Asthma   . COPD, moderate ([place])   . Dementia   . Dementia   . Duodenitis   . Esophageal stricture   . GERD (gastroesophageal reflux disease)   . Herpes zoster   . Hiatal hernia   . Hypertension   . Inflammatory polyps of colon with rectal bleeding ([place])   . Lung cancer ([place])    rt upper lobe lobectomy 2007 radiation seed implants  . Post herpetic neuralgia   . Renal cyst, right   . Sinusitis   . Strep pharyngitis   . TIA (transient ischemic attack)    Past Surgical History:  Past Surgical History:  Procedure Laterality Date  . COLONOSCOPY N/A 11/20/2014   Procedure: COLONOSCOPY;  Surgeon: Danie Binder, MD;  Location: [facility];  Service: Endoscopy;  Laterality: N/A;  1300 - moved to 1:15 - office to notify  . LAPAROSCOPIC CHOLECYSTECTOMY  2003  . LUNG REMOVAL, PARTIAL Right    right upper lung lobectomy - radiation seeds implanted   HPI:  84 year old man with past history of alcohol abuse, dementia, COPD, GERD, hiatal hernia, lung cancer in 2007 in remission presented to [facility] for evaluation of altered mental status and vomiting. CT scan of the head consistent with a right frontal hematoma measuring 97 cc with small IVH and scattered subarachnoid in the right frontal convexity with mass-effect and right-to-left subfalcine herniation and 6 mm midline shift. Referred for swallowing evaluation. Now with a concern for aspiration pneumonia   Assessment / Plan / Recommendation Clinical Impression  Patient presents with severe risk for aspiration in the setting of large right frontal  hemorrhage, baseline dementia, lethargy, and poor management of secretions. He remains alert with cues, and allows oral care, purses his lips around [place]. Follows some basic simple commands, weakly. He does not initiate a volitional swallow on command or in response to ice chips, which were removed with oral suction, though SLP did note one reflexive swallow after pt coughed on his secretions. [place] present and states family do not want to prolong suffering or long-term artificial means of nutrition, but refers to feeding tube as a "gray area." SLP discussed options including comfort feeding, temporary means (cortrak) and long term means of nutrition (PEG). Educated re: instrumental assessment (MBS) which family would consider if helpful in directing goals of care. Family clearly against long-term means of nutrition; appears they would consider temporary means depending on pt's prognosis for return to oral feeding. As pt alertness and response to commands has improved today, I feel his prognosis is fair. Will follow up next date for improvements, family education and to determine if instrumental assessment is appropriate. Recommend pt remain NPO for now; palliative consult may be beneficial if no significant improvements noted upon follow-up.   SLP Visit Diagnosis: Dysphagia, unspecified (R13.10)    Aspiration Risk  Severe aspiration risk;Risk for inadequate nutrition/hydration    Diet Recommendation NPO   Medication Administration: Via alternative means    Other  Recommendations Oral Care Recommendations: Oral care QID Other Recommendations: Remove water pitcher;Have oral suction available;Prohibited food (jello, ice cream,  thin soups)   Follow up Recommendations Other (comment)(tbd)      Frequency and Duration min 2x/week  2 weeks       Prognosis Prognosis for Safe Diet Advancement: Fair Barriers to Reach Goals: Cognitive deficits;Severity of deficits      Swallow Study    General Date of Onset: 08/22/17 HPI: 84 year old man with past history of alcohol abuse, dementia, COPD, GERD, hiatal hernia, lung cancer in 2007 in remission presented to [facility] for evaluation of altered mental status and vomiting. CT scan of the head consistent with a right frontal hematoma measuring 97 cc with small IVH and scattered subarachnoid in the right frontal convexity with mass-effect and right-to-left subfalcine herniation and 6 mm midline shift. Referred for swallowing evaluation. Now with a concern for aspiration pneumonia Type of Study: Bedside Swallow Evaluation Previous Swallow Assessment: none in chart Diet Prior to this Study: NPO Temperature Spikes Noted: Yes(101.7) Respiratory Status: Nasal cannula History of Recent Intubation: No Behavior/Cognition: Lethargic/Drowsy;Requires cueing Oral Cavity Assessment: Dry Oral Care Completed by SLP: Yes Oral Cavity - Dentition: Edentulous;Other (Comment)(has dentures, not in place) Self-Feeding Abilities: Total assist Patient Positioning: Upright in bed Baseline Vocal Quality: Not observed Volitional Cough: Wet;Weak;Congested Volitional Swallow: Unable to elicit    Oral/Motor/Sensory Function Overall Oral Motor/Sensory Function: Generalized oral weakness Facial Symmetry: Abnormal symmetry left;Suspected CN VII (facial) dysfunction Lingual Symmetry: Within Functional Limits Lingual Strength: Reduced   Ice Chips Ice chips: Impaired Presentation: Spoon Oral Phase Impairments: Reduced lingual movement/coordination;Poor awareness of bolus;Reduced labial seal Oral Phase Functional Implications: Oral holding Pharyngeal Phase Impairments: Throat Clearing - Immediate Other Comments: did not initiate swallow; removed bolus with oral suction   Thin Liquid Thin Liquid: Not tested    Nectar Thick Nectar Thick Liquid: Not tested   Honey Thick Honey Thick Liquid: Not tested   Puree Puree: Not tested   Solid   GO   Deneise Lever, MS, CCC-SLP Speech-Language Pathologist [phone] Solid: Not tested        Aliene Altes 08/22/2017,9:56 AM

## 2017-08-22 NOTE — Progress Notes (Signed)
VASCULAR LAB PRELIMINARY  PRELIMINARY  PRELIMINARY  PRELIMINARY  Carotid duplex completed.    Preliminary report:  1-39% ICA plaquing. Vertebral artery flow is antegrade.   Mykayla Brinton, RVT 08/22/2017, 3:27 PM

## 2017-08-22 NOTE — Progress Notes (Signed)
Pharmacy Antibiotic Note  Alex [name] is a 84 y.o. male admitted on 08/20/2017 with ICH. Now with a concern for aspiration pneumonia. Pharmacy has been consulted for Zosyn dosing. WBC today down to 13.8 and Tmax-101.7. SCr < 1 with CrCl ~ 50 ml/min.   Plan: Zosyn 3.375 gm every 8 hours (EI) Monitor cultures, clinical s/sx of infection and renal function  Weight: 148 lb (67.1 kg)  Temp (24hrs), Avg:100.8 F (38.2 C), Min:99.5 F (37.5 C), Max:101.7 F (38.7 C)  Recent Labs  Lab 08/20/17 1633 08/20/17 1700 08/21/17 0430 08/22/17 0449  WBC 11.7*  --  15.2* 13.8*  CREATININE 1.08  --  0.96 0.97  LATICACIDVEN  --  1.06  --   --     Estimated Creatinine Clearance: 53.9 mL/min (by C-G formula based on SCr of 0.97 mg/dL).    No Known Allergies  Antimicrobials this admission: 3/10 Zosyn>>    Microbiology results: 3/8 MRSA PCR: Negative  Thank you for allowing pharmacy to be a part of this patient's care.  Jimmy Footman, PharmD, BCPS PGY2 Infectious Diseases Pharmacy Resident Pager: [phone]  08/22/2017 9:20 AM

## 2017-08-22 NOTE — Progress Notes (Signed)
STROKE TEAM PROGRESS NOTE   SUBJECTIVE (INTERVAL HISTORY) His RN and step son are at the bedside.  Did not pass swallow, will repeat eval tomorrow, if still not passing, may consider cortrak. But family seems against long term feeding tube.  Patient right gaze improved, still severe dysarthria, but orientated to name and age, but not to time, or place.  Left upper extremity flaccid, left lower extremity mild weakness.  Currently on 3% saline Na 149, and cleviprex for BP control.  HR was high, will add metoprolol IV as he cannot take home po metoprolol. Also has a lot of secretions, will order NT suction and nebs. Resume albuterol inhaler. CXR unchanged.    OBJECTIVE Temp:  [99.5 F (37.5 C)-101.7 F (38.7 C)] 99.5 F (37.5 C) (03/10 0835) Pulse Rate:  [109-132] 121 (03/10 0700) Cardiac Rhythm: Sinus tachycardia (03/10 0400) Resp:  [17-29] 28 (03/10 0700) BP: (110-152)/(59-80) 143/60 (03/10 0700) SpO2:  [94 %-100 %] 97 % (03/10 0700)  CBC:  Recent Labs  Lab 08/20/17 1633 08/21/17 0430 08/22/17 0449  WBC 11.7* 15.2* 13.8*  NEUTROABS 10.0* 13.2*  --   HGB 11.3* 11.6* 10.6*  HCT 36.4* 35.9* 34.0*  MCV 96.0 95.7 97.7  PLT 185 163 476    Basic Metabolic Panel:  Recent Labs  Lab 08/21/17 0430  08/22/17 0449 08/22/17 0656  NA 136   < > 147* 149*  K 4.4  --  3.5  --   CL 99*  --  113*  --   CO2 25  --  26  --   GLUCOSE 138*  --  143*  --   BUN 12  --  12  --   CREATININE 0.96  --  0.97  --   CALCIUM 8.6*  --  8.5*  --    < > = values in this interval not displayed.    Lipid Panel:     Component Value Date/Time   CHOL 127 08/02/2017 1041   TRIG 111 08/02/2017 1041   TRIG 131 08/31/2014 1054   HDL 51 08/02/2017 1041   HDL 52 08/31/2014 1054   CHOLHDL 2.5 08/02/2017 1041   CHOLHDL 3.6 08/07/2010 0700   VLDL 16 08/07/2010 0700   LDLCALC 54 08/02/2017 1041   LDLCALC 65 10/05/2013 1636   HgbA1c: No results found for: HGBA1C Urine Drug Screen:     Component Value  Date/Time   LABOPIA NONE DETECTED 08/21/2017 1120   COCAINSCRNUR NONE DETECTED 08/21/2017 1120   LABBENZ NONE DETECTED 08/21/2017 1120   AMPHETMU NONE DETECTED 08/21/2017 1120   THCU NONE DETECTED 08/21/2017 1120   LABBARB NONE DETECTED 08/21/2017 1120    Alcohol Level     Component Value Date/Time   ETH <10 08/20/2017 1633    IMAGING I have personally reviewed the radiological images below and agree with the radiology interpretations.  Ct Angio Head W Or Wo Contrast 08/20/2017 IMPRESSION:  1. Motion degraded examination. No aneurysm, flow-limiting stenosis or emergent large vessel occlusion.  2. Occult aneurysm not excluded, consider DSA.   Ct Head Wo Contrast 08/20/2017 IMPRESSION:  1. Acute hemorrhage in right frontal lobe measuring up to 6.6 cm, approximately 97 cc.  2. Small volume intraventricular hemorrhage pooling in occipital horns and subarachnoid hemorrhage over right frontal convexity, MCA cistern, suprasellar cistern.  3. Mass effect with right to left subfalcine herniation and 6 mm anterior midline shift.   Mr Jeri Cos Wo Contrast 08/21/2017 IMPRESSION:  1. Slight interval increase in size of  large cortically based right frontal hematoma, now measuring 118 cc. Slightly increased regional mass effect with up to 11 mm right-to-left subfalcine herniation. No underlying mass lesion or discernible vascular abnormality. No imaging findings to suggest cerebral amyloid angiopathy.  2. Associated subarachnoid hemorrhage with right frontotemporal subdural hemorrhage as above. Small volume intraventricular hemorrhage within the lateral ventricles with stable ventricular dilatation.   Chest Port 1 View 08/20/2017 IMPRESSION:  1. Emphysema with patchy atelectasis or minimal infiltrate at the left lung base.  2. Stable postsurgical changes of the right lung.   Dg Chest Port 1 View 08/20/2017 IMPRESSION:  1. No acute cardiopulmonary disease.  2. COPD and stable changes from prior  right lung surgery.   Transthoracic Echocardiogram - pending  Bilateral Carotid Dopplers - pending   PHYSICAL EXAM Vitals:   08/22/17 0545 08/22/17 0600 08/22/17 0700 08/22/17 0835  BP: 131/65 130/61 (!) 143/60   Pulse: (!) 121 (!) 121 (!) 121   Resp: (!) 24 (!) 24 (!) 28   Temp:    99.5 F (37.5 C)  TempSrc:    Axillary  SpO2: 97% 97% 97%   Weight:        Temp:  [99.5 F (37.5 C)-101.7 F (38.7 C)] 99.5 F (37.5 C) (03/10 0835) Pulse Rate:  [109-132] 121 (03/10 0700) Resp:  [17-29] 28 (03/10 0700) BP: (110-152)/(59-80) 143/60 (03/10 0700) SpO2:  [94 %-100 %] 97 % (03/10 0700)  General - Well nourished, well developed, in no apparent distress.  Ophthalmologic - Fundi not visualized due to noncooperation.  Cardiovascular - Regular rate and rhythm.  Neuro - sleepy, but able to open eyes easily, more awake alert than yesterday.  Orientated to name, age, however not orientated to time or place.  Severe dysarthria.  Does not following simple commands, very limited language output, did not name or repeat.  Right gaze preference, not able to reach midline.  PERRL, not tracking object.  Blinking to visual threat on the right, not on the left.  Left facial droop, tongue midline in mouth.  Left upper extremity flaccid, 1/5 on pain summation.  Left lower extremity barely against gravity on pain stimulation.  Right UE and LE spontaneous movement and against gravity.  DTR 1+, Babinski test not cooperative. Sensation, coordination and gait not tested.    ASSESSMENT/PLAN Alex [name] is a 84 y.o. male with history of alcohol abuse, dementia, COPD, lung cancer with resection in 2007, TIA history, hypertension, and dementia presenting with worsening confusion. He did not receive IV t-PA due to [place].  ICH - right frontal ICH, etiology unclear, concerning for CAA  Resultant right gaze preference, left hemiparesis  CT head - Acute hemorrhage in right frontal lobe   MRI head - Slight  interval increase in size of large cortically based right frontal hematoma  CTA Head - Motion degraded examination. Occult aneurysm not excluded, consider DSA.  CT repeat 08/22/17 stable large right frontal hematoma  Carotid Doppler - pending  2D Echo - pending  LDL - 54  HgbA1c - pending  VTE prophylaxis - heparin subq Diet NPO time specified Fall precautions  aspirin 81 mg daily prior to admission, now on No antithrombotic  Ongoing aggressive stroke risk factor management  Therapy recommendations:  pending  Disposition:  Pending  Code status - DNR  Cerebral edema  CT and MRI showed large right frontal ICH  Midline shift with sub falcine herniation  On 3% saline @ 75  Na goal 150-155  Na 136->140->143->149  Na Q6h  Hypertension  Stable  On cleviprex  BP goal < 140  Hyperlipidemia  Home meds: Zocor 20 mg daily PTA  LDL 54, goal < 70  Statin on hold now due to [place]  Continue statin at discharge  Dysphagia   Did not pass swallow  Family do not want PEG  Speech following in am  NPO for now   Leukocytosis, tachycardia and fever   WBC 15.2 -> 13.8  Tmax 101.7   Home metoprolol -> will start metoprolol IV Q6h  U/A neg  CXR unchanged left basilar patchy infiltrate   Blood culture pending  Start on zosyn for empiric treatment  Other Stroke Risk Factors  Advanced age  Former cigarette smoker - quit 15 years ago  Hx of Alcohol abuse   Hx stroke/TIA  Other Active Problems  Dementia - [place]   Lung cancer 2007  [facility] day # 2  This patient is critically ill due to right frontal large ICH, cerebral edema, HTN, dysphagia, dementia and at significant risk of neurological worsening, death form hematoma enlargement, brain herniation, delirium, aspiration. This patient's care requires constant monitoring of vital signs, hemodynamics, respiratory and cardiac monitoring, review of multiple databases, neurological  assessment, discussion with family, other specialists and medical decision making of high complexity. I had long discussion with daughter and wife on the phone, updated pt current condition, treatment plan and potential prognosis. They expressed understanding and appreciation.  I spent 40 minutes of neurocritical care time in the care of this patient.  Rosalin Hawking, MD PhD Stroke Neurology 08/22/2017 10:38 AM  To contact Stroke Continuity provider, please refer to [email or web address] After hours, contact General Neurology

## 2017-08-23 ENCOUNTER — Inpatient Hospital Stay (HOSPITAL_COMMUNITY): Payer: Medicare Other

## 2017-08-23 DIAGNOSIS — R000 Tachycardia, unspecified: Secondary | ICD-10-CM

## 2017-08-23 DIAGNOSIS — R0603 Acute respiratory distress: Secondary | ICD-10-CM

## 2017-08-23 LAB — BASIC METABOLIC PANEL
Anion gap: 10 (ref 5–15)
BUN: 14 mg/dL (ref 6–20)
CALCIUM: 8.6 mg/dL — AB (ref 8.9–10.3)
CO2: 26 mmol/L (ref 22–32)
CREATININE: 1.17 mg/dL (ref 0.61–1.24)
Chloride: 126 mmol/L — ABNORMAL HIGH (ref 101–111)
GFR calc non Af Amer: 56 mL/min — ABNORMAL LOW (ref >60)
Glucose, Bld: 135 mg/dL — ABNORMAL HIGH (ref 65–99)
Potassium: 3 mmol/L — ABNORMAL LOW (ref 3.5–5.1)
Sodium: 162 mmol/L (ref 135–145)

## 2017-08-23 LAB — CBC
HCT: 34.2 % — ABNORMAL LOW (ref 39.0–52.0)
Hemoglobin: 10.6 g/dL — ABNORMAL LOW (ref 13.0–17.0)
MCH: 31 pg (ref 26.0–34.0)
MCHC: 31 g/dL (ref 30.0–36.0)
MCV: 100 fL (ref 78.0–100.0)
PLATELETS: 182 10*3/uL (ref 150–400)
RBC: 3.42 MIL/uL — ABNORMAL LOW (ref 4.22–5.81)
RDW: 15.3 % (ref 11.5–15.5)
WBC: 14.5 10*3/uL — ABNORMAL HIGH (ref 4.0–10.5)

## 2017-08-23 LAB — SODIUM
SODIUM: 161 mmol/L — AB (ref 135–145)
SODIUM: 163 mmol/L — AB (ref 135–145)

## 2017-08-23 MED ORDER — METOPROLOL TARTRATE 5 MG/5ML IV SOLN
2.5000 mg | Freq: Once | INTRAVENOUS | Status: AC
  Administered 2017-08-23: 2.5 mg via INTRAVENOUS
  Filled 2017-08-23: qty 5

## 2017-08-23 MED ORDER — ALBUTEROL SULFATE (2.5 MG/3ML) 0.083% IN NEBU
2.5000 mg | INHALATION_SOLUTION | Freq: Four times a day (QID) | RESPIRATORY_TRACT | Status: DC | PRN
  Administered 2017-08-23: 2.5 mg via RESPIRATORY_TRACT
  Filled 2017-08-23: qty 3

## 2017-08-23 MED ORDER — GLYCOPYRROLATE 0.2 MG/ML IJ SOLN
0.2000 mg | INTRAMUSCULAR | Status: DC | PRN
  Administered 2017-08-23: 0.2 mg via INTRAVENOUS
  Filled 2017-08-23: qty 1

## 2017-08-23 MED ORDER — HALOPERIDOL 1 MG PO TABS
0.5000 mg | ORAL_TABLET | ORAL | Status: DC | PRN
  Filled 2017-08-23: qty 1

## 2017-08-23 MED ORDER — LORAZEPAM 1 MG PO TABS: 1.0000 mg | ORAL_TABLET | ORAL | Status: DC | PRN

## 2017-08-23 MED ORDER — LORAZEPAM 2 MG/ML IJ SOLN: 1.0000 mg | INTRAMUSCULAR | Status: DC | PRN

## 2017-08-23 MED ORDER — HALOPERIDOL LACTATE 2 MG/ML PO CONC
0.5000 mg | ORAL | Status: DC | PRN
  Filled 2017-08-23: qty 0.3

## 2017-08-23 MED ORDER — METOPROLOL TARTRATE 5 MG/5ML IV SOLN
5.0000 mg | INTRAVENOUS | Status: DC
  Administered 2017-08-23 (×2): 5 mg via INTRAVENOUS
  Filled 2017-08-23 (×2): qty 5

## 2017-08-23 MED ORDER — MORPHINE SULFATE (PF) 4 MG/ML IV SOLN
2.0000 mg | INTRAVENOUS | Status: DC | PRN
  Administered 2017-08-23 (×2): 2 mg via INTRAVENOUS
  Filled 2017-08-23 (×2): qty 1

## 2017-08-23 MED ORDER — SODIUM CHLORIDE 0.45 % IV SOLN
INTRAVENOUS | Status: DC
  Administered 2017-08-23: 10:00:00 via INTRAVENOUS

## 2017-08-23 MED ORDER — SCOPOLAMINE 1 MG/3DAYS TD PT72
1.0000 | MEDICATED_PATCH | TRANSDERMAL | Status: DC
  Administered 2017-08-23: 1.5 mg via TRANSDERMAL
  Filled 2017-08-23 (×2): qty 1

## 2017-08-23 MED ORDER — HALOPERIDOL LACTATE 5 MG/ML IJ SOLN: 0.5000 mg | INTRAMUSCULAR | Status: DC | PRN

## 2017-08-23 MED ORDER — ONDANSETRON HCL 4 MG/2ML IJ SOLN: 4.0000 mg | Freq: Four times a day (QID) | INTRAMUSCULAR | Status: DC | PRN

## 2017-08-23 MED ORDER — BIOTENE DRY MOUTH MT LIQD: 15.0000 mL | OROMUCOSAL | Status: DC | PRN

## 2017-08-23 MED ORDER — POLYVINYL ALCOHOL 1.4 % OP SOLN
1.0000 [drp] | Freq: Four times a day (QID) | OPHTHALMIC | Status: DC | PRN
  Filled 2017-08-23: qty 15

## 2017-08-23 MED ORDER — LORAZEPAM 2 MG/ML PO CONC: 1.0000 mg | ORAL | Status: DC | PRN

## 2017-08-23 MED ORDER — ONDANSETRON 4 MG PO TBDP: 4.0000 mg | ORAL_TABLET | Freq: Four times a day (QID) | ORAL | Status: DC | PRN

## 2017-08-23 MED ORDER — GLYCOPYRROLATE 0.2 MG/ML IJ SOLN: 0.2000 mg | INTRAMUSCULAR | Status: DC | PRN

## 2017-08-23 MED ORDER — GLYCOPYRROLATE 1 MG PO TABS
1.0000 mg | ORAL_TABLET | ORAL | Status: DC | PRN
  Filled 2017-08-23: qty 1

## 2017-08-23 MED ORDER — MORPHINE SULFATE (PF) 4 MG/ML IV SOLN: 1.0000 mg | INTRAVENOUS | Status: DC | PRN

## 2017-08-23 MED ORDER — POTASSIUM CHLORIDE 10 MEQ/50ML IV SOLN
10.0000 meq | INTRAVENOUS | Status: DC
  Administered 2017-08-23 (×4): 10 meq via INTRAVENOUS
  Filled 2017-08-23 (×4): qty 50

## 2017-08-23 NOTE — Progress Notes (Signed)
PT Cancellation Note  Patient Details Name: Alex [name] MRN: [id number] DOB: 02-28-1933   Cancelled Treatment:    Reason Eval/Treat Not Completed: Other (comment)(family apparently leaning towards a palliative roll but not yet confirmed. Will hold until therapy determined appropriate)   Maya Scholer B Elianys Conry 08/23/2017, 9:48 AM  Elwyn Reach, [place]

## 2017-08-23 NOTE — Progress Notes (Signed)
CRITICAL VALUE ALERT  Critical Value:  Serum sodium level  Date & Time Notied:  08/23/2017 0545  Provider Notified: Yes  Orders Received/Actions taken: Continue to monitor sodium levels every 6 hours.

## 2017-08-23 NOTE — Plan of Care (Signed)
Patient wife and son at bedside. I had long discussion with them, updated pt current condition, treatment options and potential poor prognosis. Wife said pt has wishes that he would not want any heroic measures to keep alive without quality of life or meaningful recovery. They just had such conversation 2 months ago. They are in unanimous decision (as per patient wishes) that they want comfort care given the nature of the condition is serious and lack of QOL. Their daughter in [place] also concur this decision as per wife.  As per patient and family wishes we will initiate comfort care.  Rosalin Hawking, MD PhD Stroke Neurology 08/23/2017 1:48 PM

## 2017-08-23 NOTE — Progress Notes (Signed)
Nutrition Brief Note  Chart reviewed. Pt now transitioning to comfort care.  No nutrition interventions warranted at this time.  Please consult as needed.   [place], [place], [place] Pager [phone] After Hours Pager

## 2017-08-23 NOTE — Progress Notes (Signed)
  Speech Language Pathology Treatment: Dysphagia  Patient Details Name: Alex [name] MRN: [id number] DOB: 03/08/1933 Today's Date: 08/23/2017 Time: 0910-0930 SLP Time Calculation (min) (ACUTE ONLY): 20 min  Assessment / Plan / Recommendation Clinical Impression  Eyes open and tracking with lights dim, minimal oral response to wet swab, ice to lips, but no labial closure or ability to consume POs for comfort. Discussed results and comforted son. Expect transition to full comfort measures, but will follow chart for any further needs.   HPI HPI: 85 year old man with past history of alcohol abuse, dementia, COPD, GERD, hiatal hernia, lung cancer in 2007 in remission presented to [facility] for evaluation of altered mental status and vomiting. CT scan of the head consistent with a right frontal hematoma measuring 97 cc with small IVH and scattered subarachnoid in the right frontal convexity with mass-effect and right-to-left subfalcine herniation and 6 mm midline shift. Referred for swallowing evaluation. Now with a concern for aspiration pneumonia      SLP Plan  Continue with current plan of care       Recommendations  Diet recommendations: NPO;Other(comment)(Pt unresponsive for comfort feeds)                Plan: Continue with current plan of care       GO               [facility], MA CCC-SLP [phone]  Alex [name] 08/23/2017, 9:49 AM

## 2017-08-23 NOTE — Progress Notes (Signed)
STROKE TEAM PROGRESS NOTE   SUBJECTIVE (INTERVAL HISTORY) His RN and son are at the bedside. Pt more lethargic than yesterday, not as active as yesterday. Still wiggle right toes on request, but not following other commands. On face mask, still has tachycardia. CXR showed likely aspiration. Family so far against NG tube. Will need further discussion with family today regarding palliative care.     OBJECTIVE Temp:  [99.3 F (37.4 C)-100.8 F (38.2 C)] 99.3 F (37.4 C) (03/11 0800) Pulse Rate:  [102-134] 102 (03/11 0945) Cardiac Rhythm: Sinus tachycardia (03/11 0800) Resp:  [17-31] 21 (03/11 0945) BP: (106-156)/(54-129) 125/57 (03/11 0945) SpO2:  [87 %-99 %] 95 % (03/11 0945) FiO2 (%):  [28 %-40 %] 40 % (03/11 0820)  CBC:  Recent Labs  Lab 08/20/17 1633 08/21/17 0430 08/22/17 0449 08/23/17 0400  WBC 11.7* 15.2* 13.8* 14.5*  NEUTROABS 10.0* 13.2*  --   --   HGB 11.3* 11.6* 10.6* 10.6*  HCT 36.4* 35.9* 34.0* 34.2*  MCV 96.0 95.7 97.7 100.0  PLT 185 163 187 244    Basic Metabolic Panel:  Recent Labs  Lab 08/22/17 0449  08/23/17 0400 08/23/17 0750  NA 147*   < > 162* 163*  K 3.5  --  3.0*  --   CL 113*  --  126*  --   CO2 26  --  26  --   GLUCOSE 143*  --  135*  --   BUN 12  --  14  --   CREATININE 0.97  --  1.17  --   CALCIUM 8.5*  --  8.6*  --    < > = values in this interval not displayed.    Lipid Panel:     Component Value Date/Time   CHOL 127 08/02/2017 1041   TRIG 89 08/22/2017 1430   TRIG 131 08/31/2014 1054   HDL 51 08/02/2017 1041   HDL 52 08/31/2014 1054   CHOLHDL 2.5 08/02/2017 1041   CHOLHDL 3.6 08/07/2010 0700   VLDL 16 08/07/2010 0700   LDLCALC 54 08/02/2017 1041   LDLCALC 65 10/05/2013 1636   HgbA1c:  Lab Results  Component Value Date   HGBA1C 5.4 08/20/2017   Urine Drug Screen:     Component Value Date/Time   LABOPIA NONE DETECTED 08/21/2017 1120   COCAINSCRNUR NONE DETECTED 08/21/2017 1120   LABBENZ NONE DETECTED 08/21/2017 1120    AMPHETMU NONE DETECTED 08/21/2017 1120   THCU NONE DETECTED 08/21/2017 1120   LABBARB NONE DETECTED 08/21/2017 1120    Alcohol Level     Component Value Date/Time   ETH <10 08/20/2017 1633    IMAGING I have personally reviewed the radiological images below and agree with the radiology interpretations.  Ct Angio Head W Or Wo Contrast 08/20/2017 IMPRESSION:  1. Motion degraded examination. No aneurysm, flow-limiting stenosis or emergent large vessel occlusion.  2. Occult aneurysm not excluded, consider DSA.   Ct Head Wo Contrast 08/20/2017 IMPRESSION:  1. Acute hemorrhage in right frontal lobe measuring up to 6.6 cm, approximately 97 cc.  2. Small volume intraventricular hemorrhage pooling in occipital horns and subarachnoid hemorrhage over right frontal convexity, MCA cistern, suprasellar cistern.  3. Mass effect with right to left subfalcine herniation and 6 mm anterior midline shift.   Mr Alex [name] Wo Contrast 08/21/2017 IMPRESSION:  1. Slight interval increase in size of large cortically based right frontal hematoma, now measuring 118 cc. Slightly increased regional mass effect with up to 11 mm right-to-left subfalcine  herniation. No underlying mass lesion or discernible vascular abnormality. No imaging findings to suggest cerebral amyloid angiopathy.  2. Associated subarachnoid hemorrhage with right frontotemporal subdural hemorrhage as above. Small volume intraventricular hemorrhage within the lateral ventricles with stable ventricular dilatation.   Chest Port 1 View 08/20/2017 IMPRESSION:  1. Emphysema with patchy atelectasis or minimal infiltrate at the left lung base.  2. Stable postsurgical changes of the right lung.   Dg Chest Port 1 View 08/20/2017 IMPRESSION:  1. No acute cardiopulmonary disease.  2. COPD and stable changes from prior right lung surgery.   Transthoracic Echocardiogram  - Technically difficult study. Images are overall nondiagnostic.   Very poor acoustic  windows, tachycardia limit images. - Grossly the LV appears normal in size with normal systolic   function. - Grossly the RV appears normal in size and function. - Inferior vena cava: The vessel was normal in size. The   respirophasic diameter changes were in the normal range (>= 50%),   consistent with normal central venous pressure. - Consider repeat study or alternative imaging modality.  Bilateral Carotid Dopplers  Right Carotid: The extracranial vessels were near-normal with only minimal wall        thickening or plaque. Left Carotid: The extracranial vessels were near-normal with only minimal wall       thickening or plaque. Vertebrals: Both vertebral arteries were patent with antegrade flow. Subclavians: Normal flow hemodynamics were seen in bilateral subclavian       arteries.  PHYSICAL EXAM Vitals:   08/23/17 0820 08/23/17 0845 08/23/17 0930 08/23/17 0945  BP:  139/69 133/67 (!) 125/57  Pulse:  (!) 120 (!) 119 (!) 102  Resp:  (!) 22 (!) 22 (!) 21  Temp:      TempSrc:      SpO2: 97% 99% 95% 95%  Weight:        Temp:  [99.3 F (37.4 C)-100.8 F (38.2 C)] 99.3 F (37.4 C) (03/11 0800) Pulse Rate:  [102-134] 102 (03/11 0945) Resp:  [17-31] 21 (03/11 0945) BP: (106-156)/(54-129) 125/57 (03/11 0945) SpO2:  [87 %-99 %] 95 % (03/11 0945) FiO2 (%):  [28 %-40 %] 40 % (03/11 0820)  General - Well nourished, well developed, on face mask.  Ophthalmologic - Fundi not visualized due to noncooperation.  Cardiovascular - Regular rhythm but tachycardia.  Neuro - lethargic, eyes spontaneously open, did not have speech output today, on face mask.  Does not following simple commands except wiggle right toes on request, did not name or repeat.  Right gaze preference, but not tracking objects.  PERRL, blinking to visual threat on the right, not on the left.  Left facial droop, tongue midline in mouth.  Left upper extremity flaccid, 0/5 on pain summation.  Left  lower extremity 0/5 on pain stimulation.  Right UE and LE no spontaneous movement today but wiggle toes on the right on command.  DTR 1+, Babinski test not cooperative. Sensation, coordination and gait not tested.    ASSESSMENT/PLAN [name] is a 84 y.o. male with history of alcohol abuse, dementia, COPD, lung cancer with resection in 2007, TIA history, hypertension, and dementia presenting with worsening confusion. He did not receive IV t-PA due to [place].  ICH - right frontal ICH, etiology unclear, concerning for CAA  Resultant right gaze preference, left hemiparesis  CT head - Acute hemorrhage in right frontal lobe   MRI head - Slight interval increase in size of large cortically based right frontal hematoma  CTA  Head - Motion degraded examination. Occult aneurysm not excluded, consider DSA.  CT repeat 08/22/17 stable large right frontal hematoma  Carotid Doppler - unremarkable  2D Echo - unremarkable  LDL - 54  HgbA1c - 5.4  VTE prophylaxis - heparin subq Diet NPO time specified Fall precautions  aspirin 81 mg daily prior to admission, now on No antithrombotic  Ongoing aggressive stroke risk factor management  Therapy recommendations:  pending  Disposition:  Pending  Code status - DNR  Pending family discussion of palliative care or comfort care  Cerebral edema  CT and MRI showed large right frontal ICH  Midline shift with sub falcine herniation  Off 3% saline now - on 1/2NS @ 50  Na goal 150-155  Na 136->140->143->149->163  Na Q6h  Respiratory distress  On face mask  Difficulty handling secretion  CXR concerning for aspiration  On mucomyst neb  NT suctioning  D/c albuterol inhaler due to tachycardia  Hypertension  Stable  On cleviprex still  BP goal < 140  Hyperlipidemia  Home meds: Zocor 20 mg daily PTA  LDL 54, goal < 70  Statin on hold now due to [place]  Continue statin at discharge  Dysphagia   Did not pass  swallow  Family do not want PEG or NG as per pt wishes  Did not pass swallow  Will need to discuss about palliative care   Leukocytosis, tachycardia and fever   WBC 15.2 -> 13.8->14.5  Tmax 101.7 -> 100.8  HR 1302->110s  Home metoprolol -> will increase metoprolol IV to Q4h  U/A neg  CXR concerning for aspiration   Blood culture pending  Start on zosyn for empiric treatment  Other Stroke Risk Factors  Advanced age  Former cigarette smoker - quit 15 years ago  Hx of Alcohol abuse   Hx stroke/TIA  Other Active Problems  Dementia - in [place]   Lung cancer 2007  COPD  Hospital day # 3  This patient is critically ill due to right frontal large ICH, cerebral edema, HTN, dysphagia, dementia and at significant risk of neurological worsening, death form hematoma enlargement, brain herniation, delirium, aspiration. This patient's care requires constant monitoring of vital signs, hemodynamics, respiratory and cardiac monitoring, review of multiple databases, neurological assessment, discussion with family, other specialists and medical decision making of high complexity. I had long discussion with daughter and wife on the phone, updated pt current condition, treatment plan and potential prognosis. They expressed understanding and appreciation.  I spent 40 minutes of neurocritical care time in the care of this patient.  Rosalin Hawking, MD PhD Stroke Neurology 08/23/2017 10:36 AM  To contact Stroke Continuity provider, please refer to [email or web address] After hours, contact General Neurology

## 2017-08-23 NOTE — Progress Notes (Signed)
CRITICAL VALUE ALERT  Critical Value:  Serum sodium  Date & Time Notied:  08/23/17 0230  Provider Notified: Yes  Orders Received/Actions taken: 3% sodium drip stopped.

## 2017-08-23 NOTE — Progress Notes (Signed)
MD made aware of patient's sustaining high heart rate. New orders received. Will continue to monitor.

## 2017-08-23 NOTE — Progress Notes (Signed)
OT cancellation    08/23/17 0900  OT Visit Information  Last OT Received On 08/23/17  Reason Eval/Treat Not Completed Other (comment) Family apparently leaning towards a palliative roll but not yet confirmed. Will hold until therapy determined appropriate. Thank you.    [place], OTR/L Acute Rehab Pager: [phone] Office: [phone]

## 2017-08-23 NOTE — Progress Notes (Signed)
RT NTS pt with moderate return of thick tan/white secretions. Pt tolerated procedure well, RT to continue to monitor as needed.

## 2017-08-24 NOTE — Progress Notes (Signed)
Patient discharged to [facility] via [place].

## 2017-08-24 NOTE — Social Work (Addendum)
Clinical Social Worker facilitated patient discharge including contacting patient family and facility to confirm patient discharge plans.  Clinical information faxed to facility and family agreeable with plan.  CSW arranged ambulance transport via [facility] to [facility].  RN to call [phone] with report prior to discharge.  Clinical Social Worker will sign off for now as social work intervention is no longer needed. Please consult [place] again if new need arises.  Alexander Mt, [place] Social Worker

## 2017-08-24 NOTE — Social Work (Addendum)
CSW acknowledging consult for residential hospice, aware that pt family preference is for [facility]. CSW has placed referral to [facility].  12:00pm- Alex [name] has a bed today. Pt will be able to discharge to [facility] care when they have completed paperwork with pt wife.   CSW continuing to follow.   Alexander Mt, [place] Work [phone]

## 2017-08-24 NOTE — Progress Notes (Signed)
Patient to transfer to [facility], reported to nurse Hampton Abbot.

## 2017-08-24 NOTE — Discharge Summary (Addendum)
Stroke Discharge Summary  Patient ID: Alex [name]   MRN: [id number]      DOB: Aug 24, 1932  Date of Admission: 08/20/2017 Date of Discharge: 08/24/2017  Attending Physician:  Rosalin Hawking, MD, Stroke MD Consultant(s):    None  Patient's PCP:  Dettinger, Fransisca Kaufmann, MD  DISCHARGE DIAGNOSIS:  Right frontal large ICH with small IVH  Active Problems:   Cerebral edema (HCC)   HTN (hypertension)   Dysphagia Hyperlipidemia Leukocytosis Tachycardia Fever Aspiration Pneumonia Dementia  Past Medical History:  Diagnosis Date  . Alcohol abuse    last drink 2003  . Allergy   . Asthma   . COPD, moderate ([place])   . Dementia   . Dementia   . Duodenitis   . Esophageal stricture   . GERD (gastroesophageal reflux disease)   . Herpes zoster   . Hiatal hernia   . Hypertension   . Inflammatory polyps of colon with rectal bleeding ([place])   . Lung cancer ([place])    rt upper lobe lobectomy 2007 radiation seed implants  . Post herpetic neuralgia   . Renal cyst, right   . Sinusitis   . Strep pharyngitis   . TIA (transient ischemic attack)    Past Surgical History:  Procedure Laterality Date  . COLONOSCOPY N/A 11/20/2014   Procedure: COLONOSCOPY;  Surgeon: Danie Binder, MD;  Location: [facility];  Service: Endoscopy;  Laterality: N/A;  1300 - moved to 1:15 - office to notify  . LAPAROSCOPIC CHOLECYSTECTOMY  2003  . LUNG REMOVAL, PARTIAL Right    right upper lung lobectomy - radiation seeds implanted    Allergies as of 08/24/2017   No Known Allergies     Medication List    STOP taking these medications   amLODipine 5 MG tablet Commonly known as:  NORVASC   CENTRUM SILVER ADULT 50+ PO   cetirizine 10 MG tablet Commonly known as:  ZYRTEC   esomeprazole 40 MG capsule Commonly known as:  NEXIUM   fluticasone 50 MCG/ACT nasal spray Commonly known as:  FLONASE   memantine 10 MG tablet Commonly known as:  NAMENDA   metoprolol tartrate 50 MG tablet Commonly known as:   LOPRESSOR   PROAIR HFA 108 (90 Base) MCG/ACT inhaler Generic drug:  albuterol   simvastatin 20 MG tablet Commonly known as:  ZOCOR   umeclidinium-vilanterol 62.5-25 MCG/INH Aepb Commonly known as:  ANORO ELLIPTA      LABORATORY STUDIES CBC    Component Value Date/Time   WBC 14.5 (H) 08/23/2017 0400   RBC 3.42 (L) 08/23/2017 0400   HGB 10.6 (L) 08/23/2017 0400   HGB 12.5 (L) 01/28/2017 1103   HCT 34.2 (L) 08/23/2017 0400   HCT 38.7 01/28/2017 1103   PLT 182 08/23/2017 0400   PLT 213 01/28/2017 1103   MCV 100.0 08/23/2017 0400   MCV 95 01/28/2017 1103   MCH 31.0 08/23/2017 0400   MCHC 31.0 08/23/2017 0400   RDW 15.3 08/23/2017 0400   RDW 13.5 01/28/2017 1103   LYMPHSABS 1.1 08/21/2017 0430   LYMPHSABS 2.3 01/28/2017 1103   MONOABS 0.9 08/21/2017 0430   EOSABS 0.0 08/21/2017 0430   EOSABS 0.4 01/28/2017 1103   BASOSABS 0.0 08/21/2017 0430   BASOSABS 0.1 01/28/2017 1103   CMP    Component Value Date/Time   NA 163 (HH) 08/23/2017 0750   NA 139 08/02/2017 1041   K 3.0 (L) 08/23/2017 0400   CL 126 (H) 08/23/2017 0400  CO2 26 08/23/2017 0400   GLUCOSE 135 (H) 08/23/2017 0400   BUN 14 08/23/2017 0400   BUN 8 08/02/2017 1041   CREATININE 1.17 08/23/2017 0400   CALCIUM 8.6 (L) 08/23/2017 0400   PROT 6.9 08/21/2017 0430   PROT 7.1 08/02/2017 1041   ALBUMIN 3.5 08/21/2017 0430   ALBUMIN 4.0 08/02/2017 1041   AST 29 08/21/2017 0430   ALT 11 (L) 08/21/2017 0430   ALKPHOS 79 08/21/2017 0430   BILITOT 0.8 08/21/2017 0430   BILITOT 0.4 08/02/2017 1041   GFRNONAA 56 (L) 08/23/2017 0400   GFRAA >60 08/23/2017 0400   COAGS Lab Results  Component Value Date   INR 1.12 08/21/2017   INR 1.01 08/20/2017   INR 1.05 08/08/2010   Lipid Panel    Component Value Date/Time   CHOL 127 08/02/2017 1041   TRIG 89 08/22/2017 1430   TRIG 131 08/31/2014 1054   HDL 51 08/02/2017 1041   HDL 52 08/31/2014 1054   CHOLHDL 2.5 08/02/2017 1041   CHOLHDL 3.6 08/07/2010 0700   VLDL  16 08/07/2010 0700   LDLCALC 54 08/02/2017 1041   LDLCALC 65 10/05/2013 1636   HgbA1C  Lab Results  Component Value Date   HGBA1C 5.4 08/20/2017   Urinalysis    Component Value Date/Time   COLORURINE YELLOW 08/21/2017 1120   APPEARANCEUR CLEAR 08/21/2017 1120   LABSPEC 1.020 08/21/2017 1120   PHURINE 6.0 08/21/2017 1120   GLUCOSEU NEGATIVE 08/21/2017 1120   HGBUR SMALL (A) 08/21/2017 1120   BILIRUBINUR NEGATIVE 08/21/2017 1120   KETONESUR 5 (A) 08/21/2017 1120   PROTEINUR NEGATIVE 08/21/2017 1120   UROBILINOGEN 0.2 08/06/2010 1705   NITRITE NEGATIVE 08/21/2017 1120   LEUKOCYTESUR NEGATIVE 08/21/2017 1120   Urine Drug Screen     Component Value Date/Time   LABOPIA NONE DETECTED 08/21/2017 1120   COCAINSCRNUR NONE DETECTED 08/21/2017 1120   LABBENZ NONE DETECTED 08/21/2017 1120   AMPHETMU NONE DETECTED 08/21/2017 1120   THCU NONE DETECTED 08/21/2017 1120   LABBARB NONE DETECTED 08/21/2017 1120    Alcohol Level    Component Value Date/Time   ETH <10 08/20/2017 1633   SIGNIFICANT DIAGNOSTIC STUDIES   Ct Angio Head W Or Wo Contrast 08/20/2017 IMPRESSION:  1. Motion degraded examination. No aneurysm, flow-limiting stenosis or emergent large vessel occlusion.  2. Occult aneurysm not excluded, consider DSA.   Ct Head Wo Contrast 08/20/2017 IMPRESSION:  1. Acute hemorrhage in right frontal lobe measuring up to 6.6 cm, approximately 97 cc.  2. Small volume intraventricular hemorrhage pooling in occipital horns and subarachnoid hemorrhage over right frontal convexity, MCA cistern, suprasellar cistern.  3. Mass effect with right to left subfalcine herniation and 6 mm anterior midline shift.   Mr Jeri Cos Wo Contrast 08/21/2017 IMPRESSION:  1. Slight interval increase in size of large cortically based right frontal hematoma, now measuring 118 cc. Slightly increased regional mass effect with up to 11 mm right-to-left subfalcine herniation. No underlying mass lesion or  discernible vascular abnormality. No imaging findings to suggest cerebral amyloid angiopathy.  2. Associated subarachnoid hemorrhage with right frontotemporal subdural hemorrhage as above. Small volume intraventricular hemorrhage within the lateral ventricles with stable ventricular dilatation.   Chest Port 1 View 08/20/2017 IMPRESSION:  1. Emphysema with patchy atelectasis or minimal infiltrate at the left lung base.  2. Stable postsurgical changes of the right lung.   Dg Chest Port 1 View 08/20/2017 IMPRESSION:  1. No acute cardiopulmonary disease.  2. COPD and stable changes  from prior right lung surgery.   Transthoracic Echocardiogram  - Technically difficult study. Images are overall nondiagnostic. Very poor acoustic windows, tachycardia limit images. - Grossly the LV appears normal in size with normal systolic function. - Grossly the RV appears normal in size and function. - Inferior vena cava: The vessel was normal in size. The respirophasic diameter changes were in the normal range (>= 50%), consistent with normal central venous pressure. - Consider repeat study or alternative imaging modality.  Bilateral Carotid Dopplers  Right Carotid: The extracranial vessels were near-normal with only minimal wall        thickening or plaque. Left Carotid: The extracranial vessels were near-normal with only minimal wall       thickening or plaque. Vertebrals: Both vertebral arteries were patent with antegrade flow. Subclavians: Normal flow hemodynamics were seen in bilateral subclavian       arteries.  HISTORY OF PRESENT ILLNESS   &    HOSPITAL COURSE HPI: Alex [name] is a 84 y.o. male past medical history of alcohol abuse, dementia, COPD, lung cancer 2007, presented to the emergency room at [facility] for evaluation when he was found by his wife who lives separately and checks on him regularly, to be confused. He had not filled his pillbox  with his normal pills according to the chart review.   His wife helps him with filling up his pillbox and trying to help him with ADLs but noticed that he appeared more and more confused.  As the day progressed, he became nauseated and had multiple episodes of vomiting.  The patient was unable to describe what the problems were or what he was feeling at that time.  There is no report of any drug overdose or alcohol abuse.  Unclear what the last known normal time is. Patient is unable to provide any coherent history.  No family at bedside at this time  ICH Score: 3 (for age, IVH and volume)  ASSESSMENT/PLAN Alex [name] is a 84 y.o. male with history of alcohol abuse, dementia, COPD, lung cancer with resection in 2007, TIA history, hypertension, and dementia presenting with worsening confusion. He did not receive IV t-PA due to [place].  ICH - right frontal ICH, etiology unclear, concerning for CAA  Resultant right gaze preference, left hemiparesis  CT head - Acute hemorrhage in right frontal lobe   MRI head - Slight interval increase in size of large cortically based right frontal hematoma  CTA Head - Motion degraded examination. Occult aneurysm not excluded, consider DSA.  CT repeat 08/22/17 stable large right frontal hematoma  Carotid Doppler - unremarkable  2D Echo - unremarkable  LDL - 54  HgbA1c - 5.4  VTE prophylaxis - heparin subq  Diet NPO time specified  Fall precautions  aspirin 81 mg daily prior to admission, now on No antithrombotic  Ongoing aggressive stroke risk factor management  Therapy recommendations:  Hospice  Disposition: residential Hospice  Code status - DNR  [facility] in [place], [place]  Cerebral edema  CT and MRI showed large right frontal ICH  Midline shift with sub falcine herniation  Off 3% saline now   Na 136->140->143->149->163  Respiratory distress  On [place] at 2L/min  Difficulty handling secretion  CXR  concerning for aspiration  D/c albuterol inhaler due to tachycardia  Hypertension  Stable  Hyperlipidemia  Home meds: Zocor 20 mg daily PTA  LDL 54, goal < 70  Statin on hold now due to [place]  Dysphagia   Did not pass swallow  Family do not want PEG or NG as per pt wishes  Leukocytosis, tachycardia and fever   WBC 15.2 -> 13.8->14.5  Tmax 101.7 -> 100.8  HR 1302->110s  metoprolol IV to Q4h  U/A neg  CXR concerning for aspiration   Blood culture - NGTD  Other Stroke Risk Factors  Advanced age  Former cigarette smoker - quit 15 years ago  Hx of Alcohol abuse   Hx stroke/TIA  Other Active Problems  Dementia - in [place]   Lung cancer 2007  [facility] day # 4  DISCHARGE EXAM Family at bedside. Support given. Ready for discharge to hospice.   Blood pressure (!) 159/69, pulse (!) 111, temperature 99.7 F (37.6 C), resp. rate 14, weight 67.1 kg (148 lb), SpO2 90 %. General - Well nourished, well developed, in NAD, actively dying, family at bedside.  Ophthalmologic - Fundi not visualized due to noncooperation. Cardiovascular - Regular rhythm but tachycardia. Neuro - lethargic, dose not open eyes to voice. No speech output today,  Does not follow any commands. Right gaze preference, blinking to visual threat on the right, not on the left.  Left facial droop, tongue midline in mouth.  Left upper extremity flaccid, 0/5 on pain summation.  Left lower extremity 0/5 on pain stimulation.  Right UE and LE no spontaneous movement.   Discharge Diet   Diet NPO time specified Fall precautions liquids  DISCHARGE PLAN  Disposition:  [facility] in [place], [place] than 30 minutes were spent preparing discharge.  Rosalin Hawking, MD PhD Stroke Neurology 08/24/2017 10:52 PM

## 2017-08-27 LAB — CULTURE, BLOOD (ROUTINE X 2)
Culture: NO GROWTH
Culture: NO GROWTH
SPECIAL REQUESTS: ADEQUATE
Special Requests: ADEQUATE

## 2017-09-13 DEATH — deceased

## 2018-02-01 ENCOUNTER — Ambulatory Visit: Payer: Medicare Other | Admitting: Family Medicine

## 2018-10-21 IMAGING — CT CT HEAD W/O CM
4 series · 15 of 47 positions shown, 17 images · non-contrast
Comparison: 08/21/2016 MRI of the head. 08/20/2016 CT and CTA of
the head.

CLINICAL DATA: 85 y/o M; intraventricular hemorrhage with cerebral
edema.

EXAM:
CT HEAD WITHOUT CONTRAST
TECHNIQUE: Contiguous axial images were obtained from the base of the skull
through the vertex without intravenous contrast.

[Series 3: head wo · axial · 0.44mm/px · z∈[-186,-60]mm · 7 of 35 slices shown, 9 images]
[im 5/35  brain]
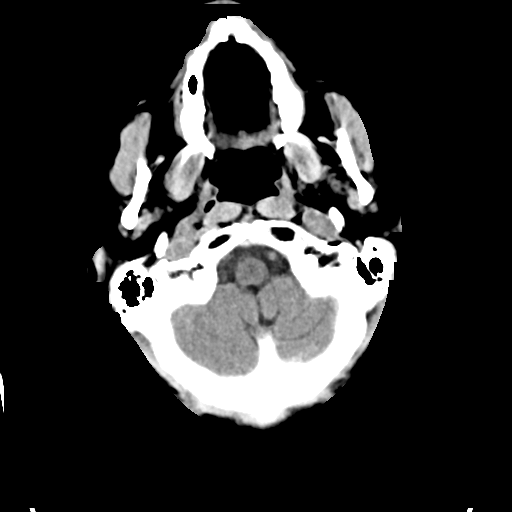
[im 5/35  bone]
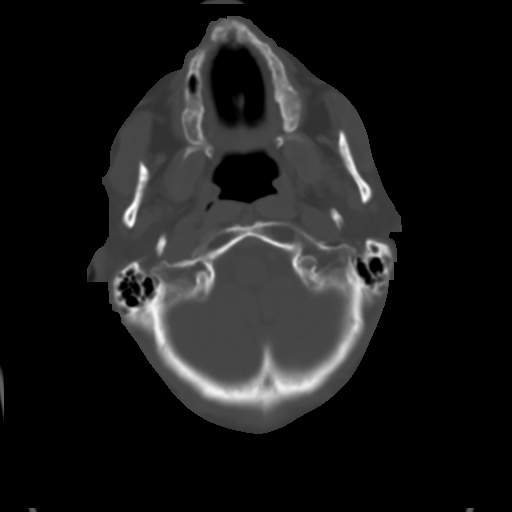
[im 9/35  brain]
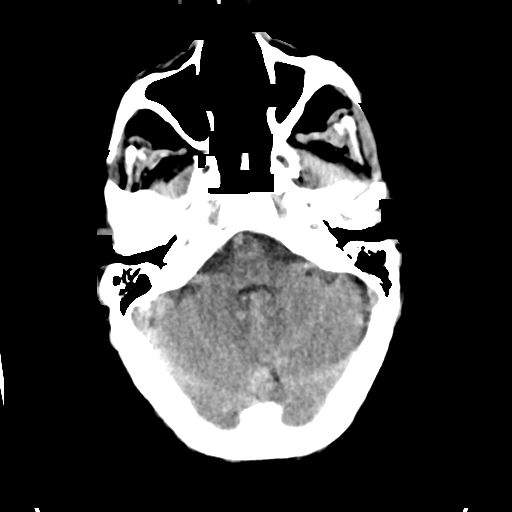
[im 13/35  brain]
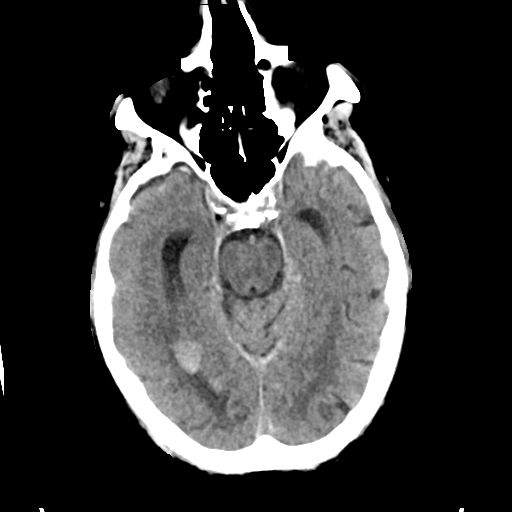
[im 18/35  brain]
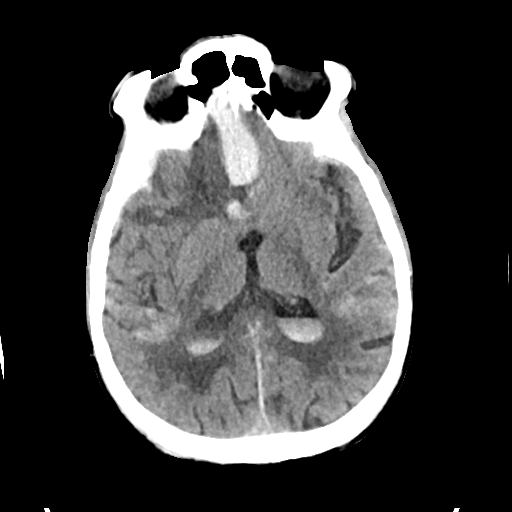
[im 22/35  brain]
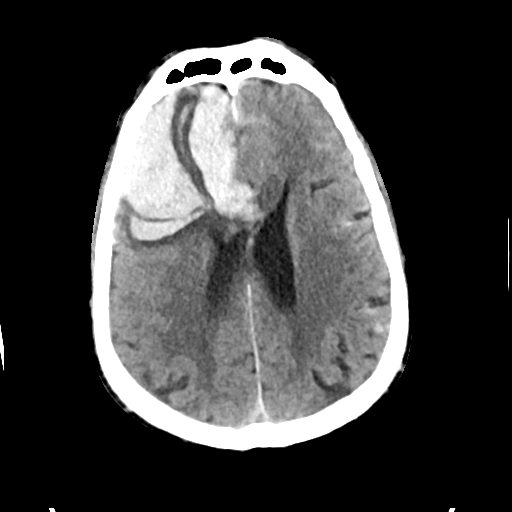
[im 22/35  bone]
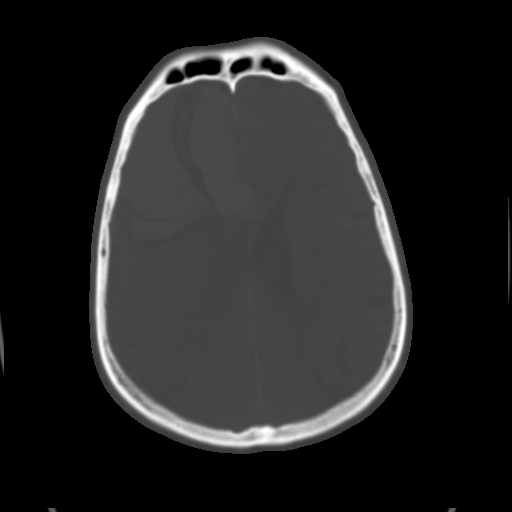
[im 26/35  brain]
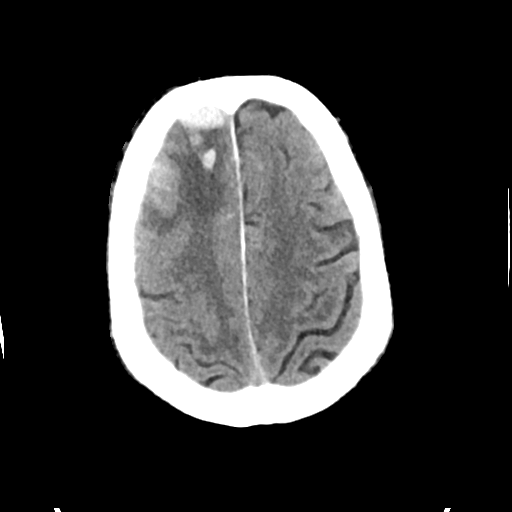
[im 30/35  brain]
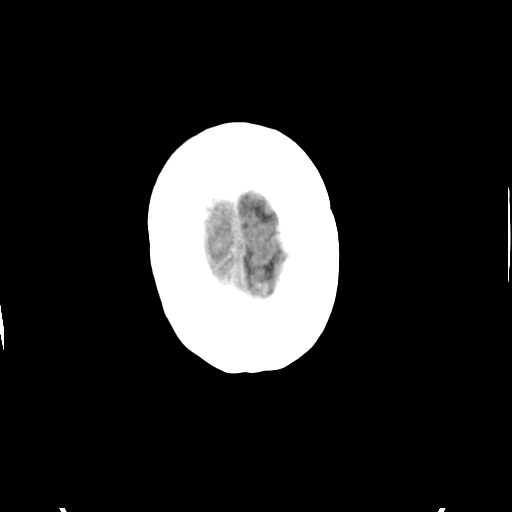

[Series 4: head bone · axial · 0.44mm/px · z∈[-190,-172]mm · 2 of 88 slices shown]
[im 9/88  bone]
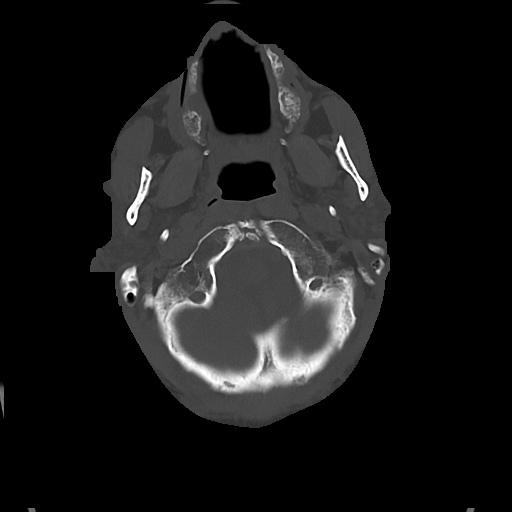
[im 18/88  bone]
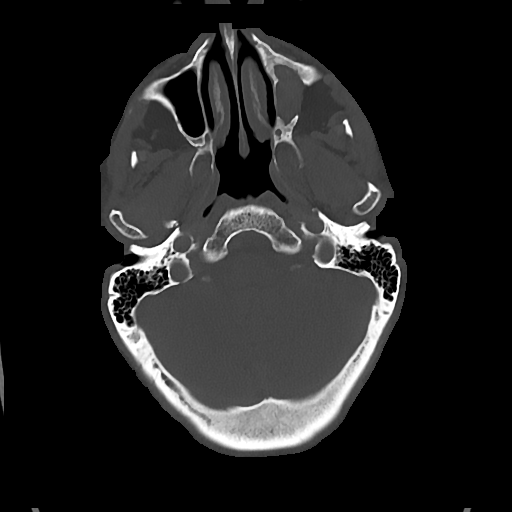

[Series 5: cor soft · coronal · 0.34mm/px · 3 of 66 slices shown]
[im 22/66  brain]
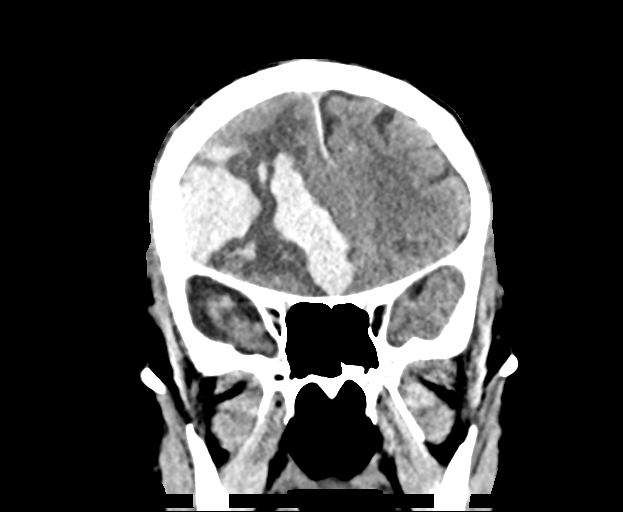
[im 29/66  brain]
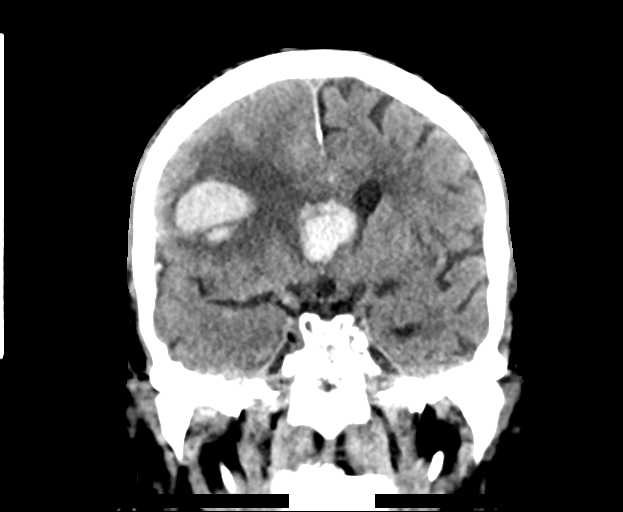
[im 37/66  brain]
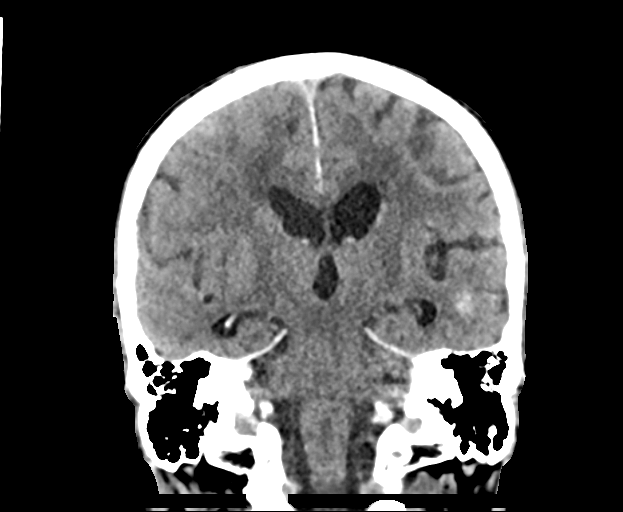

[Series 6: sag soft · sagittal · 0.34mm/px · 3 of 50 slices shown]
[im 17/50  brain]
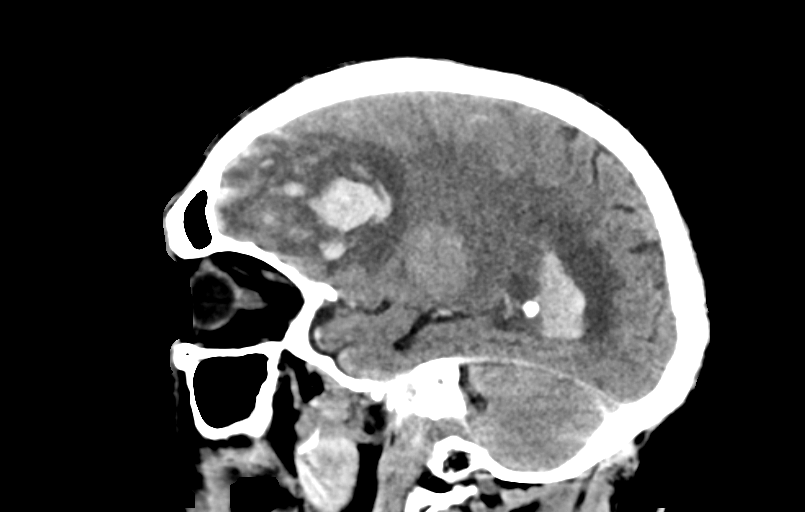
[im 25/50  brain]
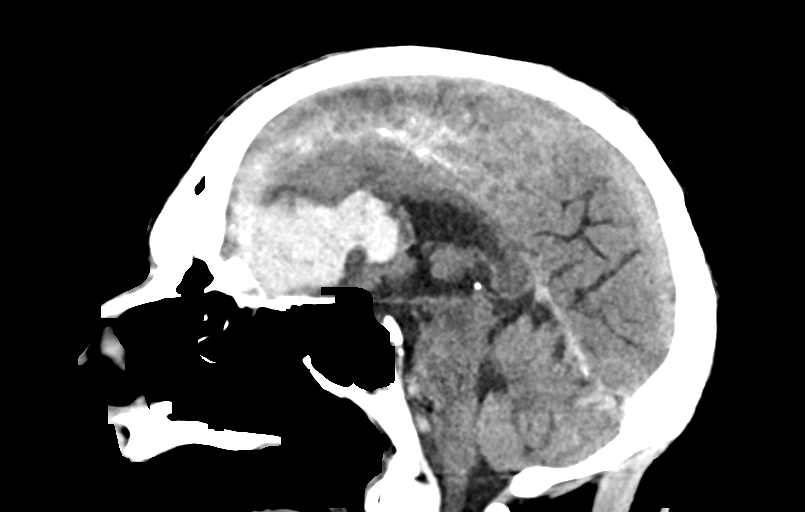
[im 33/50  brain]
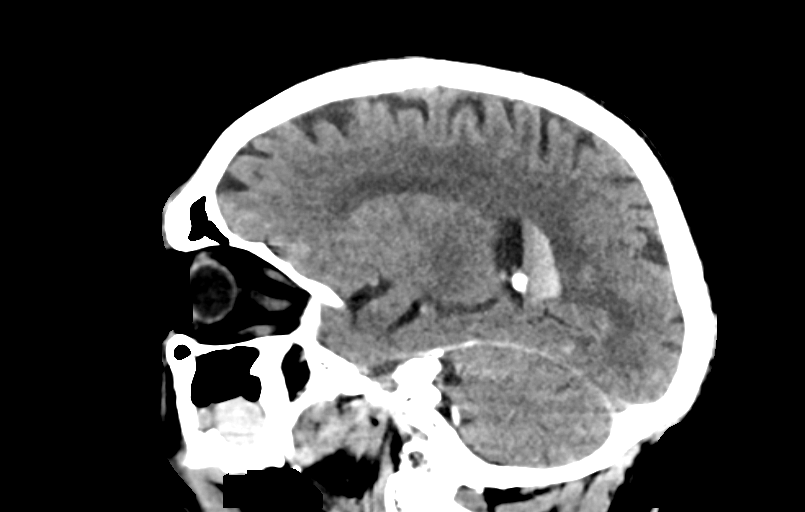

[15 of 47 positions shown; findings below may reference images not displayed]

FINDINGS: Brain: Right frontal lobe hematoma measures 6.8 x 7.0 x 4.9 (volume
= 120), series 3, image 21 and series 5, image 15. Hematoma is
grossly stable from the prior MRI given differences in technique.
Associated edema and mass effect with right to left subfalcine
herniation and approximately 10 mm right-to-left anterior midline
shift is stable. Moderate volume of intraventricular hemorrhage is
pooling within the occipital horns and there is scattered
subarachnoid hemorrhage over the cerebral convexities. No new acute
intracranial hemorrhage is identified.

Vascular: No hyperdense vessel or unexpected calcification.

Skull: Normal. Negative for fracture or focal lesion.

Sinuses/Orbits: Partial opacification of the left maxillary sinus
and mild right maxillary sinus mucosal thickening. Normal aeration
of mastoid air cells. Bilateral intra-ocular lens replacement.

Other: None.
IMPRESSION: 1. Stable large hematoma in the right frontal lobe, 120 cc, in
comparison with prior MRI given differences in technique.
2. Stable associated edema and mass effect with right to left
subfalcine herniation and 10 mm anterior midline shift.
3. Stable volume intraventricular hemorrhage and subarachnoid
hemorrhage over convexities. Stable mild enlargement of the lateral
and third ventricles.
4. No new acute intracranial abnormality identified. No downward
herniation.

By: Hellder Faust M.D.

## 2018-10-22 IMAGING — DX DG CHEST 1V PORT
1 series · 1 of 1 positions shown · non-contrast
Comparison: Multiple comparison, most recent 08/22/2017,
08/21/2017, 08/20/2017

CLINICAL DATA: 85-year-old male with a history aspiration

EXAM:
PORTABLE CHEST 1 VIEW

[chest ap]
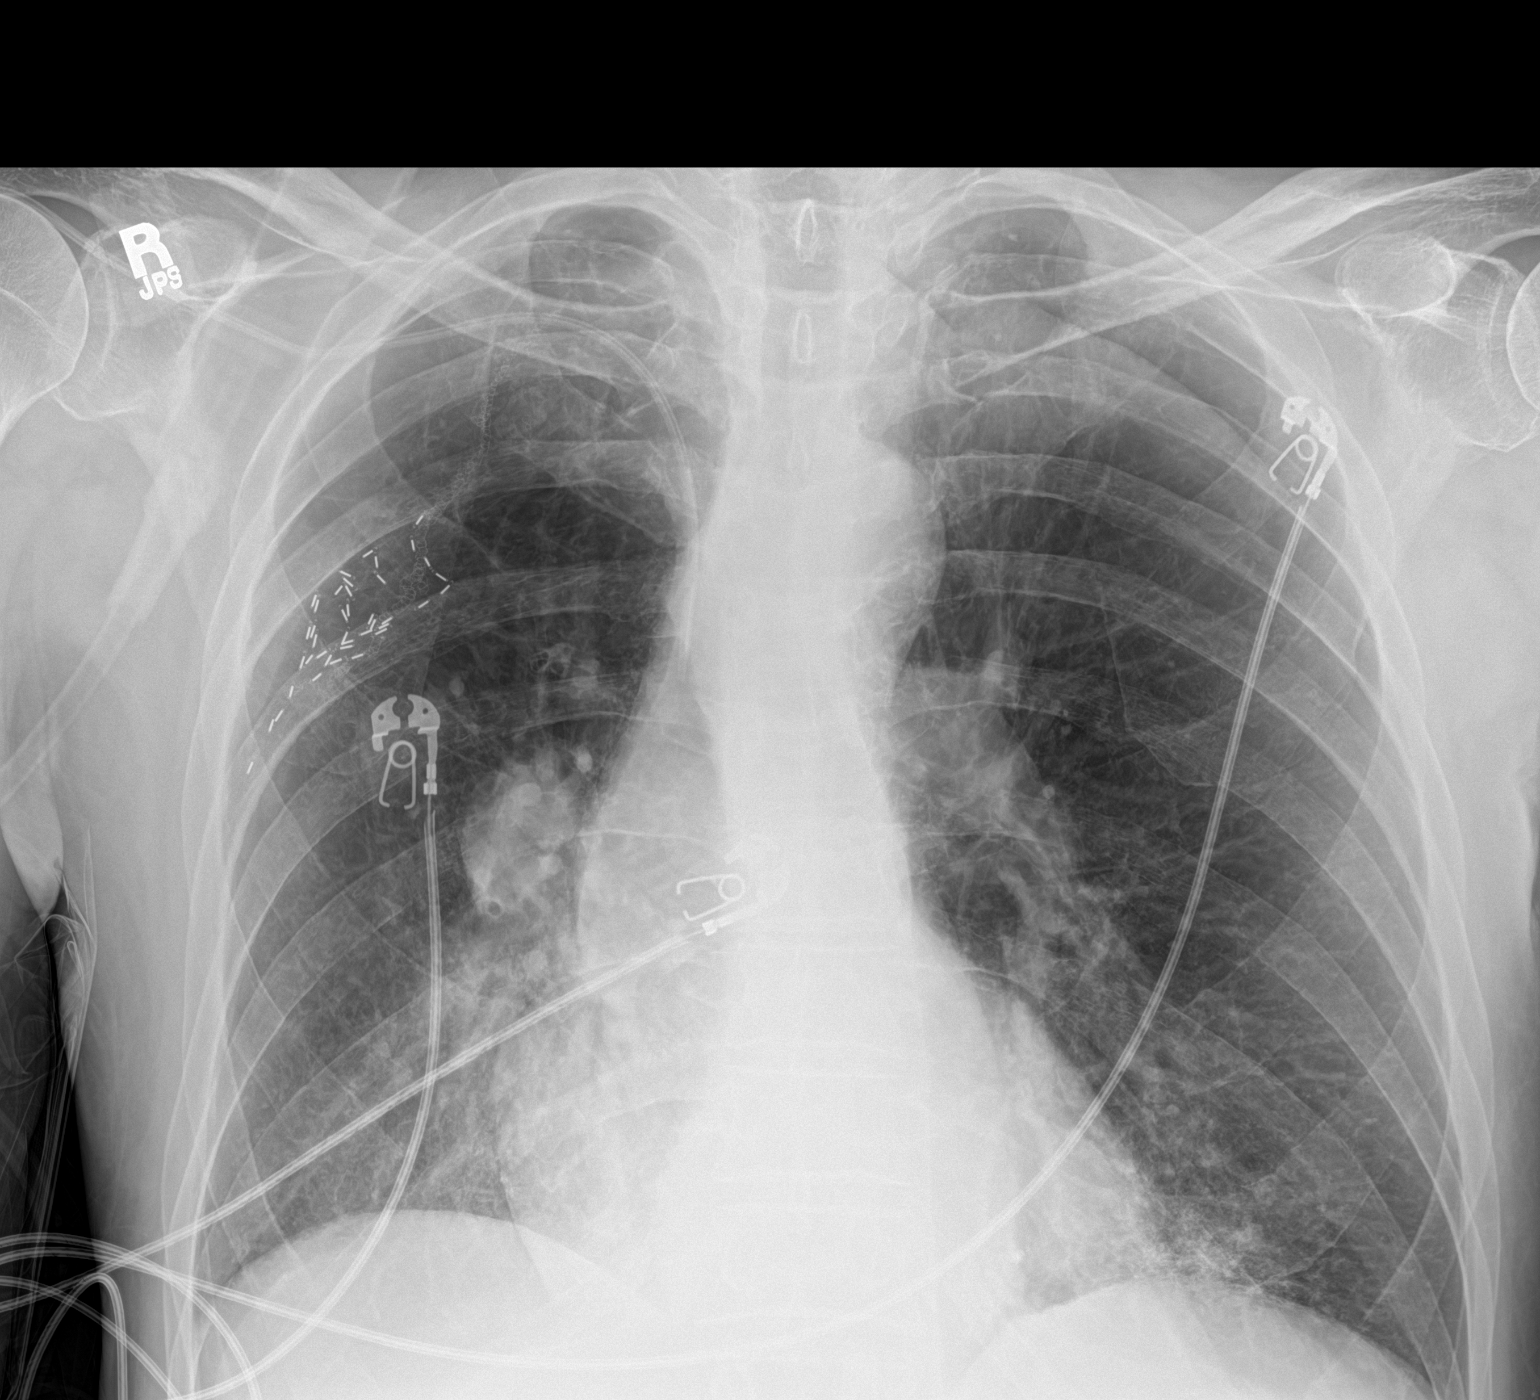

[1 of 1 positions shown; findings below may reference images not displayed]

FINDINGS: Cardiomediastinal silhouette unchanged in size and contour.

Surgical changes of the right lung. Unchanged position of right
subclavian central catheter with the tip appearing to terminate
superior vena cava.

No pneumothorax.

Increasing opacity at the bilateral lung bases partially obscuring
the heart borders.
IMPRESSION: Increasing airspace opacity of the bilateral lower lungs, suggestive
of aspiration and/or developing pneumonia given the history.

Unchanged right subclavian central venous catheter.
